# Patient Record
Sex: Male | Born: 1965 | Hispanic: Yes | Marital: Married | Smoking: Never smoker
Health system: Southern US, Community
[De-identification: ages and names within clinical notes are randomized; demographics above are authoritative.]

## PROBLEM LIST (undated history)

## (undated) DIAGNOSIS — J45909 Unspecified asthma, uncomplicated: Secondary | ICD-10-CM

---

## 2005-03-09 HISTORY — PX: COLONOSCOPY: SHX174

## 2005-03-09 LAB — HM COLONOSCOPY: HM COLON: NORMAL

## 2006-12-06 ENCOUNTER — Ambulatory Visit: Payer: Self-pay | Admitting: Internal Medicine

## 2009-03-21 ENCOUNTER — Ambulatory Visit: Payer: Self-pay | Admitting: Internal Medicine

## 2009-11-29 ENCOUNTER — Ambulatory Visit: Payer: Self-pay | Admitting: Internal Medicine

## 2011-01-30 ENCOUNTER — Ambulatory Visit: Payer: Self-pay

## 2011-02-25 ENCOUNTER — Ambulatory Visit: Payer: Self-pay | Admitting: Internal Medicine

## 2011-11-25 ENCOUNTER — Ambulatory Visit: Payer: Self-pay | Admitting: Internal Medicine

## 2013-03-18 LAB — LIPID PANEL
Cholesterol: 250 mg/dL — AB (ref 0–200)
HDL: 37 mg/dL (ref 35–70)
LDL Cholesterol: 192 mg/dL
TRIGLYCERIDES: 104 mg/dL (ref 40–160)

## 2013-03-18 LAB — PSA: PSA: 0.8

## 2013-03-18 LAB — BASIC METABOLIC PANEL
BUN: 19 mg/dL (ref 4–21)
Creatinine: 0.7 mg/dL (ref <=1.3)

## 2013-03-18 LAB — CBC AND DIFFERENTIAL: HEMOGLOBIN: 15.1 g/dL (ref 13.5–17.5)

## 2013-04-07 ENCOUNTER — Ambulatory Visit: Payer: Self-pay | Admitting: Gastroenterology

## 2013-12-18 ENCOUNTER — Ambulatory Visit: Payer: Self-pay | Admitting: Physician Assistant

## 2013-12-18 LAB — RAPID STREP-A WITH REFLX: Micro Text Report: NEGATIVE

## 2013-12-21 LAB — BETA STREP CULTURE(ARMC)

## 2014-03-20 ENCOUNTER — Ambulatory Visit: Payer: Self-pay | Admitting: Internal Medicine

## 2014-07-22 ENCOUNTER — Ambulatory Visit: Payer: Self-pay

## 2014-07-22 ENCOUNTER — Ambulatory Visit
Admission: EM | Admit: 2014-07-22 | Discharge: 2014-07-22 | Disposition: A | Discharge status: Home / Self Care | Payer: Self-pay | Attending: Family Medicine | Admitting: Family Medicine

## 2014-07-22 ENCOUNTER — Encounter: Payer: Self-pay | Admitting: *Deleted

## 2014-07-22 DIAGNOSIS — J45909 Unspecified asthma, uncomplicated: Secondary | ICD-10-CM | POA: Insufficient documentation

## 2014-07-22 DIAGNOSIS — J209 Acute bronchitis, unspecified: Secondary | ICD-10-CM

## 2014-07-22 DIAGNOSIS — R0602 Shortness of breath: Secondary | ICD-10-CM | POA: Insufficient documentation

## 2014-07-22 HISTORY — DX: Unspecified asthma, uncomplicated: J45.909

## 2014-07-22 MED ORDER — METHYLPREDNISOLONE SODIUM SUCC 125 MG IJ SOLR
125.0000 mg | Freq: Once | INTRAMUSCULAR | Status: AC
  Administered 2014-07-22: 125 mg via INTRAMUSCULAR

## 2014-07-22 MED ORDER — PREDNISONE 10 MG (21) PO TBPK: ORAL_TABLET | ORAL | Status: DC

## 2014-07-22 MED ORDER — ALBUTEROL SULFATE HFA 108 (90 BASE) MCG/ACT IN AERS: 2.0000 | INHALATION_SPRAY | RESPIRATORY_TRACT | Status: DC | PRN

## 2014-07-22 MED ORDER — IPRATROPIUM-ALBUTEROL 0.5-2.5 (3) MG/3ML IN SOLN
3.0000 mL | Freq: Once | RESPIRATORY_TRACT | Status: AC
  Administered 2014-07-22: 3 mL via RESPIRATORY_TRACT

## 2014-07-22 MED ORDER — IPRATROPIUM-ALBUTEROL 0.5-2.5 (3) MG/3ML IN SOLN
3.0000 mL | RESPIRATORY_TRACT | Status: DC
  Administered 2014-07-22: 3 mL via RESPIRATORY_TRACT

## 2014-07-22 MED ORDER — AZITHROMYCIN 250 MG PO TABS: ORAL_TABLET | ORAL | Status: DC

## 2014-07-22 NOTE — Discharge Instructions (Signed)
Asma, broncoespasmo agudo (Asthma, Acute Bronchospasm) El broncoespasmo agudo causado por el asma tambin se conoce como crisis de asma. Broncoespasmo significa que las vas respiratorias se han estrechado. La causa del estrechamiento es la inflamacin y la constriccin de los msculos de las vas respiratorias (bronquios) que se encuentran en los pulmones. Esto puede dificultar la respiracin o provocarle sibilancias y tos. [place] desencadenantes posibles son: La caspa que eliminan los animales de la piel, el pelo o las plumas de [place]. Los caros que se encuentran en el polvo de la casa. [place]. El polen de los rboles o el csped. [place]. El humo del cigarrillo o del tabaco Sustancias contaminantes como el polvo, limpiadores hogareos, aerosoles (como los [place] para el cabello), vapores de pintura, sustancias qumicas fuertes u olores intensos. El aire fro o cambios climticos. El aire fro puede causar inflamacin. El viento aumenta la cantidad de moho y polen del aire. Emociones fuertes, Engineer, production o rer intensamente. Estrs. Ciertos medicamentos como la aspirina o betabloqueantes. Los sulfitos que se encuentran en las comidas y bebidas como frutas secas y el vino. Enfermedades infecciosas o inflamatorias, como la gripe, el resfro o la inflamacin de las membranas nasales (rinitis). El reflujo gastroesofgico (ERGE). El reflujo gastroesofgico es una afeccin en la que los cidos estomacales vuelven al esfago. Los ejercicios o actividades extenuantes. [place]. Tos intensa, especialmente por la noche. Opresin en el pecho. Falta de aire. DIAGNSTICO  El mdico le har una historia clnica y le har un examen fsico. [place] Comment indicarn radiografas o anlisis de sangre para buscar otras causas de los sntomas u otras enfermedades que puedan desencadenar una crisis de asma.  TRATAMIENTO  El tratamiento est dirigido a reducir la inflamacin y [place] vas respiratorias en los pulmones. La mayor parte de las crisis asmticas se tratan con medicamentos por va inhalatoria. Entre ellos se incluyen los medicamentos de alivio rpido o medicamentos de rescate (como los broncodilatadores) y los medicamentos de control (como los corticoides inhalados). Estos medicamentos se administran a travs de Educational psychologist o de un nebulizador. Los corticoides sistmicos por va oral o por va intravenosa tambin se administran para reducir la inflamacin cuando un ataque es moderado o grave. Los antibiticos se indican solo si hay infeccin bacteriana.  INSTRUCCIONES PARA EL CUIDADO EN EL HOGAR  Reposo. Beba lquido en abundancia. Esto ayuda a diluir la mucosidad y a Transport planner. Solo consuma productos con cafena moderadamente y no consuma alcohol hasta que se haya recuperado de la enfermedad. No fume. Evite la exposicin al humo de otros fumadores. Usted tiene un rol fundamental en mantener su buena salud. Evite la exposicin a lo que [facility] respiratorios. Mantenga los medicamentos actualizados y al alcance. Siga cuidadosamente el plan de tratamiento del mdico. Utilice los medicamentos tal como se le indic. Cuando haya mucho polen o polucin, mantenga las ventanas cerradas y use el aire acondicionado o vaya a lugares con aire acondicionado. El asma requiere atencin [place]. Concurra a los controles segn las indicaciones. Si tiene Nutritional therapist de ms de 24 semanas y le han recetado medicamentos nuevos, comntelo con su obstetra y cul es su evolucin. Concurra a las consultas de control con su mdico segn las indicaciones. Despus de recuperarse de la crisis de asma, haga una cita con el mdico para conocer cmo puede reducir la probabilidad de futuros ataques. Si no cuenta con un mdico para que controle su asma, haga una cita con un  mdico de atencin primaria para hablar de esta enfermedad. Valentine DE  INMEDIATO SI:  Empeora. Tiene dificultad para respirar. Si la dificultad es intensa comunquese con el servicio de Multimedia programmer de su localidad (911 en los [place]). Siente dolor o Adult nurse. Tiene vmitos. No puede retener los lquidos. Elimina una expectoracin verde, amarilla, amarronada o sanguinolenta. Tiene fiebre y los sntomas empeoran repentinamente. Presenta dificultad para tragar. ASEGRESE DE QUE:  Comprende estas instrucciones. Controlar su afeccin. Recibir ayuda de inmediato si no mejora o si empeora. Document Released: 06/11/2008 Document Revised: 02/28/2013 [facility] Patient Information 2015 [place]. This information is not intended to replace advice given to you by your health care provider. Make sure you discuss any questions you have with your health care provider. Bronquitis aguda (Acute Bronchitis) Se denomina bronquitis cuando las vas respiratorias que van desde la trquea [employer] se irritan, se congestionan y duelen (se inflaman). La bronquitis generalmente produce flema espesa (mucosidad). Esto provoca tos. La tos es el sntoma ms frecuente de la bronquitis. Cuando la bronquitis es [place], generalmente comienza de [place] sbita y desaparece luego de algn tiempo (generalmente en 2 semanas). El hbito de fumar, las alergias y el asma pueden empeorar la bronquitis. Los episodios repetidos de bronquitis pueden causar ms problemas pulmonares. CUIDADOS EN EL HOGAR  Reposo.  Beba abundante cantidad de lquidos para mantener el pis orina) claro o amarillo plido (excepto que debe limitar la ingesta de lquidos por indicacin del mdico).  Tome slo medicamentos de venta libre o recetados, segn las indicaciones del mdico.  Evite fumar o aspirar el humo de otros fumadores. Esto puede empeorar la bronquitis. Si es fumador, considere el uso de chicles o parches en la piel de nicotina. Si deja de fumar, sus pulmones se curarn ms  rpido.  Reduzca la probabilidad de enfermarse nuevamente de bronquitis de este modo:  Lvese las manos con frecuencia.  Evite las personas que tengan sntomas de resfro.  Trate de no llevarse las manos a la boca, la nariz o los ojos.  Concurra a las consultas de control con el mdico, segn las indicaciones. SOLICITE AYUDA SI: Los sntomas no mejoran despus de 1 semana de tratamiento. Los sntomas son:  Donnal Moat.  Cristy Hilts.  Eliminar moco espeso al toser.  Dolores [employer] cuerpo.  Congestin en el pecho.  Escalofros.  Falta de aire.  Dolor de Investment banker, operational. SOLICITE AYUDA DE INMEDIATO SI:   Le sube la fiebre.  Tiene escalofros.  Comienza a Buyer, retail aire de manera preocupante.  Tiene expectoracin con sangre (esputo).  Devuelve (vomita) con frecuencia.  Su organismo pierde mucho lquido (deshidratacin).  Sufre un dolor intenso de [place].  Se desmaya. ASEGRESE DE QUE:   Comprende estas instrucciones.  Controlar su afeccin.  Recibir ayuda de inmediato si no mejora o si empeora. Document Released: 10/26/2012 [facility] Patient Information 2015 [place]. This information is not intended to replace advice given to you by your health care provider. Make sure you discuss any questions you have with your health care provider.

## 2014-07-22 NOTE — ED Provider Notes (Signed)
CSN: [id number]     Arrival date & time 07/22/14  1448 History   First MD Initiated Contact with Patient 07/22/14 1518     Chief Complaint  Patient presents with  . Shortness of Breath   (Consider location/radiation/quality/duration/timing/severity/associated sxs/prior Treatment) Patient is a 49 y.o. male presenting with shortness of breath. The history is provided by the patient and a significant other. The history is limited by a language barrier. No language interpreter was used Katrinka Blazing(Smith other provides most of the transition).  Shortness of Breath Severity:  Severe Onset quality:  Unable to specify Duration:  3 months (He's had trouble breathing for over 3 months according to his significant other.) Timing:  Intermittent Progression:  Waxing and waning (She states that the breathing has gotten so bad today that she felt that even though they did not have insurance she need for him to be seen and evaluated.) Chronicity:  Chronic (She she reports that she is giving him some prednisone tablets he has not used before she is M use her inhaler and that her daughter had a weak sinus antibiotic that she did not know the name of which she allowed him to take as well. All these different me) Context: activity, fumes and URI   Relieved by:  Inhaler Worsened by:  Coughing, deep breathing and smoke exposure Ineffective treatments:  Inhaler Associated symptoms: sputum production   Risk factors: no tobacco use     Past Medical History  Diagnosis Date  . Asthma    History reviewed. No pertinent past surgical history. Family History  Problem Relation Age of Onset  . Stroke Father   . Cancer Sister   . Cancer Brother    History  Substance Use Topics  . Smoking status: Never Smoker   . Smokeless tobacco: Not on file  . Alcohol Use: No    Review of Systems  Unable to perform ROS HENT: Positive for congestion.   Respiratory: Positive for sputum production and shortness of breath.   All  other systems reviewed and are negative.   Allergies  Penicillins  Home Medications   Prior to Admission medications   Not on File   BP 132/81 mmHg  Pulse 86  Temp(Src) 98.5 F (36.9 C) (Tympanic)  Resp   Ht 5\' 8"  (1.727 m)  Wt 185 lb (83.915 kg)  BMI 28.14 kg/m2  SpO2 93% Physical Exam  Constitutional: He appears well-nourished. He appears distressed.  HENT:  Head: Normocephalic and atraumatic.  Right Ear: External ear normal.  Left Ear: External ear normal.  Nose: Nose normal.  Mouth/Throat: Oropharynx is clear and moist.  Eyes: Pupils are equal, round, and reactive to light.  Neck: Neck supple. No tracheal deviation present.  Cardiovascular: Normal rate and regular rhythm.   Pulmonary/Chest: Tachypnea noted. He has decreased breath sounds. He has wheezes. Chest wall is not dull to percussion. He exhibits no mass.  Musculoskeletal: Normal range of motion.  Lymphadenopathy:    He has no cervical adenopathy.  Neurological: He is alert.  Skin: Skin is warm. No rash noted. No erythema.  Nursing note and vitals reviewed.   ED Course  Procedures (including critical care time) Labs Review Labs Reviewed - No data to display'Imaging Review No results found.  Patient sent for a chest x-ray. Given two aerosol treatments here, Solu-Medrol injection, will be placed on oral prednisone for probably 6 days, antibiotic, and we'll give him his own inhaler. [facility]  MDM  No diagnosis found.     Hassan RowanEugene Helmi Hechavarria, MD 07/22/14 [id number]

## 2014-07-22 NOTE — ED Notes (Signed)
Known history of asthma, self medicated with left over family pednisone and a "weak antibiotic"  [facility]  Wheezing A/P, audible wheezing, moving very little air.  Dr Thurmond ButtsWade in to assess patient.  Meds given, chest xray pending.  O2 sats 93&% on RA.

## 2014-11-24 ENCOUNTER — Ambulatory Visit: Payer: Self-pay

## 2014-11-24 ENCOUNTER — Ambulatory Visit
Admission: EM | Admit: 2014-11-24 | Discharge: 2014-11-24 | Disposition: A | Discharge status: Home / Self Care | Payer: Self-pay | Attending: Family Medicine | Admitting: Family Medicine

## 2014-11-24 DIAGNOSIS — J45901 Unspecified asthma with (acute) exacerbation: Secondary | ICD-10-CM

## 2014-11-24 MED ORDER — PREDNISONE 50 MG PO TABS: 50.0000 mg | ORAL_TABLET | Freq: Every day | ORAL | Status: DC

## 2014-11-24 MED ORDER — IPRATROPIUM-ALBUTEROL 0.5-2.5 (3) MG/3ML IN SOLN
3.0000 mL | Freq: Once | RESPIRATORY_TRACT | Status: AC
  Administered 2014-11-24: 3 mL via RESPIRATORY_TRACT

## 2014-11-24 MED ORDER — ALBUTEROL SULFATE HFA 108 (90 BASE) MCG/ACT IN AERS: 1.0000 | INHALATION_SPRAY | Freq: Four times a day (QID) | RESPIRATORY_TRACT | Status: DC | PRN

## 2014-11-24 MED ORDER — SULFAMETHOXAZOLE-TRIMETHOPRIM 800-160 MG PO TABS: 1.0000 | ORAL_TABLET | Freq: Two times a day (BID) | ORAL | Status: DC

## 2014-11-24 NOTE — Discharge Instructions (Signed)

## 2014-11-24 NOTE — ED Provider Notes (Signed)
CSN: [id number]     Arrival date & time 11/24/14  1428 History   First MD Initiated Contact with Patient 11/24/14 1556     Chief Complaint  Patient presents with  . Cough    Pt started yesterday with cough, chest congestion, and wheezing. Tried his inhaler but not helping. Chest and head hurts with coughing and deep breathing    (Consider location/radiation/quality/duration/timing/severity/associated sxs/prior Treatment) HPI this a 49 year old Hispanic male presents with 2 weeks history of difficulty breathing and productive cough of green-yellow sputum and chills but no fever. He has a history of asthma has had flareups in the past. Heused the albuterol inhaler but to no avail. He became much worse yesterday and presents today with audible wheezing. Yesterday he states that he had a 2 hour coughing spell with pain over his right upper chest. Today he is afebrile with an O2 sat of 97%. Pulse rate of 83. He does not smoke.    Past Medical History  Diagnosis Date  . Asthma    History reviewed. No pertinent past surgical history. Family History  Problem Relation Age of Onset  . Stroke Father   . Cancer Sister   . Cancer Brother    Social History  Substance Use Topics  . Smoking status: Never Smoker   . Smokeless tobacco: None  . Alcohol Use: No    Review of Systems  Constitutional: Positive for chills. Negative for fever and fatigue.  HENT: Positive for congestion.   Respiratory: Positive for cough, shortness of breath and wheezing.   All other systems reviewed and are negative.   Allergies  Penicillins  Home Medications   Prior to Admission medications   Medication Sig Start Date End Date Taking? Authorizing Provider  albuterol (PROVENTIL HFA;VENTOLIN HFA) 108 (90 BASE) MCG/ACT inhaler Inhale 2 puffs into the lungs every 4 (four) hours as needed for wheezing or shortness of breath. 07/22/14   Hassan Rowan, MD  albuterol (PROVENTIL HFA;VENTOLIN HFA) 108 (90 BASE) MCG/ACT  inhaler Inhale 1-2 puffs into the lungs every 6 (six) hours as needed for wheezing or shortness of breath. 11/24/14   Lutricia Feil, PA-C  azithromycin (ZITHROMAX Z-PAK) 250 MG tablet Take 2 tablets first day and then 1 po a day for 4 days 07/22/14   Hassan Rowan, MD  predniSONE (DELTASONE) 50 MG tablet Take 1 tablet (50 mg total) by mouth daily. 11/24/14   Lutricia Feil, PA-C  sulfamethoxazole-trimethoprim (BACTRIM DS,SEPTRA DS) 800-160 MG per tablet Take 1 tablet by mouth 2 (two) times daily. 11/24/14   Lutricia Feil, PA-C   Meds Ordered and Administered this Visit   Medications  ipratropium-albuterol (DUONEB) 0.5-2.5 (3) MG/3ML nebulizer solution 3 mL (3 mLs Nebulization Given 11/24/14 1610)    BP 145/85 mmHg  Pulse 83  Temp(Src) 97.9 F (36.6 C) (Tympanic)  Ht  (1.626 m)  Wt 177 lb (80.287 kg)  BMI 30.37 kg/m2  SpO2 97% No data found.   Physical Exam  Constitutional: He is oriented to person, place, and time. He appears well-developed and well-nourished.  HENT:  Head: Normocephalic and atraumatic.  Right Ear: External ear normal.  Left Ear: External ear normal.  Eyes: Pupils are equal, round, and reactive to light.  Neck: Neck supple.  Cardiovascular: Normal rate and regular rhythm.  Exam reveals no gallop and no friction rub.   No murmur heard. Pulmonary/Chest: Effort normal. No stridor. No respiratory distress. He has wheezes. He has no rales. He exhibits no tenderness.  Lymphadenopathy:    He has no cervical adenopathy.  Neurological: He is alert and oriented to person, place, and time.  Skin: Skin is warm and dry.  Psychiatric: He has a normal mood and affect. His behavior is normal. Judgment and thought content normal.  Nursing note and vitals reviewed.   ED Course  Procedures (including critical care time)  Labs Review Labs Reviewed - No data to display  Imaging Review Dg Chest 2 View  11/24/2014   CLINICAL DATA:  Cough, asthma  EXAM: CHEST  2 VIEW   COMPARISON:  07/22/2014  FINDINGS: Cardiomediastinal silhouette is stable. No acute infiltrate or pleural effusion. No pulmonary edema. Bony thorax is unremarkable.  IMPRESSION: No active cardiopulmonary disease.   Electronically Signed   By: Natasha Mead M.D.   On: 11/24/2014 16:25     Visual Acuity Review  Right Eye Distance:   Left Eye Distance:   Bilateral Distance:    Right Eye Near:   Left Eye Near:    Bilateral Near:     Medications  ipratropium-albuterol (DUONEB) 0.5-2.5 (3) MG/3ML nebulizer solution 3 mL (3 mLs Nebulization Given 11/24/14 1610)      MDM   1. Asthmatic bronchitis with acute exacerbation    New Prescriptions   ALBUTEROL (PROVENTIL HFA;VENTOLIN HFA) 108 (90 BASE) MCG/ACT INHALER    Inhale 1-2 puffs into the lungs every 6 (six) hours as needed for wheezing or shortness of breath.   PREDNISONE (DELTASONE) 50 MG TABLET    Take 1 tablet (50 mg total) by mouth daily.   SULFAMETHOXAZOLE-TRIMETHOPRIM (BACTRIM DS,SEPTRA DS) 800-160 MG PER TABLET    Take 1 tablet by mouth 2 (two) times daily.  Plan: 1. Test/x-ray results and diagnosis reviewed with patient 2. rx as per orders; risks, benefits, potential side effects reviewed with patient 3. Recommend supportive treatment with albuterol.  4. F/u PCP next week.  Lutricia Feil, PA-C 11/24/14 1714

## 2015-01-01 ENCOUNTER — Encounter: Payer: Self-pay | Admitting: Internal Medicine

## 2015-01-01 DIAGNOSIS — L509 Urticaria, unspecified: Secondary | ICD-10-CM | POA: Insufficient documentation

## 2015-01-01 DIAGNOSIS — Z800 Family history of malignant neoplasm of digestive organs: Secondary | ICD-10-CM | POA: Insufficient documentation

## 2015-01-01 DIAGNOSIS — K219 Gastro-esophageal reflux disease without esophagitis: Secondary | ICD-10-CM | POA: Insufficient documentation

## 2015-01-04 ENCOUNTER — Ambulatory Visit (INDEPENDENT_AMBULATORY_CARE_PROVIDER_SITE_OTHER): Payer: Self-pay | Admitting: Internal Medicine

## 2015-01-04 ENCOUNTER — Encounter: Payer: Self-pay | Admitting: Internal Medicine

## 2015-01-04 VITALS — BP 128/60 | HR 125 | Temp 100.6°F | Ht 64.0 in | Wt 175.4 lb

## 2015-01-04 DIAGNOSIS — J45901 Unspecified asthma with (acute) exacerbation: Secondary | ICD-10-CM

## 2015-01-04 MED ORDER — METHYLPREDNISOLONE 4 MG PO TBPK: ORAL_TABLET | ORAL | Status: DC

## 2015-01-04 MED ORDER — ALBUTEROL SULFATE (2.5 MG/3ML) 0.083% IN NEBU: 2.5000 mg | INHALATION_SOLUTION | Freq: Four times a day (QID) | RESPIRATORY_TRACT | Status: DC | PRN

## 2015-01-04 MED ORDER — AZITHROMYCIN 250 MG PO TABS: ORAL_TABLET | ORAL | Status: DC

## 2015-01-04 NOTE — Progress Notes (Signed)
Date:  01/04/2015   Name:  Ronnie Hunt   DOB:  1965-05-12   MRN:  [id number]   Chief Complaint: Asthma  Patient had been struggling with asthma exacerbation for the past 6 weeks. He was seen in September at urgent care. He was prescribed a prednisone pulse dose and Bactrim. He has continued to use an albuterol inhaler. He states that he only improved slightly and then over the past several weeks has been getting worse. Last night he did not sleep because of shortness of breath wheezing and headache. His only medication is a ProAir inhaler.  He has a tight cough productive primarily of thick white phlegm but occasionally yellow phlegm.   Review of Systems  Constitutional: Positive for fever, diaphoresis and fatigue.  Respiratory: Positive for chest tightness, shortness of breath and wheezing. Negative for choking.   Cardiovascular: Negative for chest pain and palpitations.  Gastrointestinal: Negative for vomiting, abdominal pain and diarrhea.  Musculoskeletal: Negative for neck pain.  Neurological: Positive for tremors, light-headedness and headaches. Negative for syncope.    Patient Active Problem List   Diagnosis Date Noted  . Family history of colon cancer 01/01/2015  . Gastro-esophageal reflux disease without esophagitis 01/01/2015  . Asthma, mild intermittent 01/01/2015  . Hive 01/01/2015    Prior to Admission medications   Medication Sig Start Date End Date Taking? Authorizing Provider  albuterol (PROVENTIL HFA;VENTOLIN HFA) 108 (90 BASE) MCG/ACT inhaler Inhale 2 puffs into the lungs every 4 (four) hours as needed for wheezing or shortness of breath. 07/22/14  Yes Ronnie RowanEugene Wade, MD    Allergies  Allergen Reactions  . Penicillins Swelling    Past Surgical History  Procedure Laterality Date  . Colonoscopy  2007    normal    Social History  Substance Use Topics  . Smoking status: Never Smoker   . Smokeless tobacco: None  . Alcohol Use: 2.4 oz/week    4 Standard  drinks or equivalent per week     Medication list has been reviewed and updated.    Physical Exam  Constitutional: He appears well-developed. He appears distressed.  Cardiovascular: Regular rhythm.  Tachycardia present.   Pulmonary/Chest: Accessory muscle usage present. Tachypnea noted. He is in respiratory distress. He has decreased breath sounds. He has wheezes in the right upper field, the right middle field, the right lower field, the left upper field, the left middle field and the left lower field.  Skin: He is diaphoretic.  Nursing note and vitals reviewed.  Patient is given 2 albuterol nebulizer breathing treatments. After which he had improved air movement and subjective improvement in shortness of breath. Wheezes persist throughout all lung zones.   BP 128/60 mmHg  Pulse 120  Temp(Src) 100.6 F (38.1 C)  Ht 5\' 4"  (1.626 m)  Wt 175 lb 6.4 oz (79.561 kg)  BMI 30.09 kg/m2  SpO2 89%  Assessment and Plan: 1. Asthma with exacerbation, unspecified asthma severity Patient will purchase a nebulizer Begin Dulera 100/5 one to 2 puffs twice a day  (samples given) May use albuterol MDI when away from home - azithromycin (ZITHROMAX Z-PAK) 250 MG tablet; Take 2 tabs on day 1 the one a day for 4 more days  Dispense: 6 each; Refill: 0 - methylPREDNISolone (MEDROL DOSEPAK) 4 MG TBPK tablet; Take 6 pills on day 1 the 5 pills day 2 then 4 pills day 3 then 3 pills day 4 then 2 pills day 5 then one pills day 6 then stop  Dispense: 21  tablet; Refill: 0 - albuterol (PROVENTIL) (2.5 MG/3ML) 0.083% nebulizer solution; Take 3 mLs (2.5 mg total) by nebulization every 6 (six) hours as needed for wheezing or shortness of breath.  Dispense: 150 mL; Refill: 1   Bari Edward, MD [facility]  01/04/2015

## 2015-01-04 NOTE — Progress Notes (Signed)
Date:  01/04/2015   Name:  Loyal GamblerJulio Reynoso Woody   DOB:  10/31/1965   MRN:  [id number]   Chief Complaint: Asthma   Review of Systems  Patient Active Problem List   Diagnosis Date Noted  . Family history of colon cancer 01/01/2015  . Gastro-esophageal reflux disease without esophagitis 01/01/2015  . Asthma, mild intermittent 01/01/2015  . Hive 01/01/2015    Prior to Admission medications   Medication Sig Start Date End Date Taking? Authorizing Provider  albuterol (PROVENTIL HFA;VENTOLIN HFA) 108 (90 BASE) MCG/ACT inhaler Inhale 2 puffs into the lungs every 4 (four) hours as needed for wheezing or shortness of breath. 07/22/14  Yes Hassan RowanEugene Wade, MD    Allergies  Allergen Reactions  . Penicillins Swelling    Past Surgical History  Procedure Laterality Date  . Colonoscopy  2007    normal    Social History  Substance Use Topics  . Smoking status: Never Smoker   . Smokeless tobacco: None  . Alcohol Use: 2.4 oz/week    4 Standard drinks or equivalent per week     Medication list has been reviewed and updated.  Physical Examination:  Physical Exam  BP 128/60 mmHg  Pulse 120  Temp(Src) 100.6 F (38.1 C)  Ht 5\' 4"  (1.626 m)  Wt 175 lb 6.4 oz (79.561 kg)  BMI 30.09 kg/m2  SpO2 89%  Assessment and Plan:

## 2015-01-07 ENCOUNTER — Ambulatory Visit: Payer: Self-pay | Admitting: Internal Medicine

## 2015-12-10 ENCOUNTER — Ambulatory Visit: Payer: Self-pay | Admitting: Internal Medicine

## 2016-02-12 ENCOUNTER — Ambulatory Visit (INDEPENDENT_AMBULATORY_CARE_PROVIDER_SITE_OTHER): Payer: Self-pay | Admitting: Internal Medicine

## 2016-02-12 ENCOUNTER — Encounter: Payer: Self-pay | Admitting: Internal Medicine

## 2016-02-12 ENCOUNTER — Other Ambulatory Visit: Payer: Self-pay | Admitting: Internal Medicine

## 2016-02-12 VITALS — BP 132/80 | HR 74 | Temp 98.1°F | Resp 16 | Ht 64.0 in | Wt 190.0 lb

## 2016-02-12 DIAGNOSIS — J4520 Mild intermittent asthma, uncomplicated: Secondary | ICD-10-CM

## 2016-02-12 DIAGNOSIS — J40 Bronchitis, not specified as acute or chronic: Secondary | ICD-10-CM

## 2016-02-12 MED ORDER — MUPIROCIN CALCIUM 2 % EX CREA: 1.0000 "application " | TOPICAL_CREAM | Freq: Two times a day (BID) | CUTANEOUS | 0 refills | Status: DC

## 2016-02-12 MED ORDER — ALBUTEROL SULFATE (2.5 MG/3ML) 0.083% IN NEBU: 2.5000 mg | INHALATION_SOLUTION | Freq: Four times a day (QID) | RESPIRATORY_TRACT | 1 refills | Status: DC | PRN

## 2016-02-12 MED ORDER — AZITHROMYCIN 250 MG PO TABS: ORAL_TABLET | ORAL | 0 refills | Status: DC

## 2016-02-12 MED ORDER — ALBUTEROL SULFATE HFA 108 (90 BASE) MCG/ACT IN AERS: 2.0000 | INHALATION_SPRAY | RESPIRATORY_TRACT | 5 refills | Status: DC | PRN

## 2016-02-12 NOTE — Patient Instructions (Signed)
Flovent 2 puffs twice a day - routinely (samples).

## 2016-02-12 NOTE — Progress Notes (Signed)
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 Wheezing at night and coughing x 4 weeks. Running out of solution for nebulizer but using it daily. ) Asthma  He complains of chest tightness, cough (productive), shortness of breath and wheezing. This is a chronic problem. The problem occurs intermittently. Pertinent negatives include no chest pain, ear pain, fever or sore throat. His symptoms are not alleviated by beta-agonist. His past medical history is significant for asthma.  He has no insurance and can not afford a steroid inhaler.  Review of Systems  Constitutional: Positive for fatigue. Negative for chills, fever and unexpected weight change.  HENT: Negative for dental problem, ear pain, sinus pain and sore throat.   Eyes: Negative for visual disturbance.  Respiratory: Positive for cough (productive), shortness of breath and wheezing.   Cardiovascular: Negative for chest pain and palpitations.  Gastrointestinal: Negative for constipation and vomiting.  Psychiatric/Behavioral: Positive for sleep disturbance (from wheezing).    Patient Active Problem List   Diagnosis Date Noted  . Family history of colon cancer 01/01/2015  . Gastro-esophageal reflux disease without esophagitis 01/01/2015  . Asthma, mild intermittent 01/01/2015  . Hive 01/01/2015    Prior to Admission medications   Medication Sig Start Date End Date Taking? Authorizing Provider  albuterol (PROVENTIL HFA;VENTOLIN HFA) 108 (90 BASE) MCG/ACT inhaler Inhale 2 puffs into the lungs every 4 (four) hours as needed for wheezing or shortness of breath. 07/22/14  Yes Hassan RowanEugene Wade, MD  albuterol (PROVENTIL) (2.5 MG/3ML) 0.083% nebulizer solution Take 3 mLs (2.5 mg total) by nebulization every 6 (six) hours as needed for wheezing or shortness of breath. 01/04/15  Yes Reubin MilanLaura H Uday Jantz, MD     Allergies  Allergen Reactions  . Penicillins Swelling    Past Surgical History:  Procedure Laterality Date  . COLONOSCOPY  2007   normal    Social History  Substance Use Topics  . Smoking status: Never Smoker  . Smokeless tobacco: Never Used  . Alcohol use 2.4 oz/week    4 Standard drinks or equivalent per week     Medication list has been reviewed and updated.   Physical Exam  Constitutional: He is oriented to person, place, and time. He appears well-developed. No distress.  HENT:  Head: Normocephalic and atraumatic.  Cardiovascular: Normal rate, regular rhythm and normal heart sounds.   Pulmonary/Chest: Effort normal. No accessory muscle usage. No respiratory distress. He has decreased breath sounds. He has wheezes. He has no rhonchi.  Musculoskeletal: Normal range of motion.  Neurological: He is alert and oriented to person, place, and time.  Skin: Skin is warm and dry. No rash noted.  Psychiatric: He has a normal mood and affect. His behavior is normal. Thought content normal.  Nursing note and vitals reviewed.   BP 132/80   Pulse 74   Temp 98.1 F (36.7 C)   Resp 16   Ht 5\' 4"  (1.626 m)   Wt 190 lb (86.2 kg)   SpO2 95%   BMI 32.61 kg/m   Assessment and Plan: 1. Mild intermittent asthma without complication Samples of Flovent 44 mcg - 2 puffs bid given - albuterol (PROVENTIL HFA;VENTOLIN HFA) 108 (90 Base) MCG/ACT inhaler; Inhale 2 puffs into the lungs every 4 (four) hours as needed for wheezing or shortness of breath.  Dispense: 1 Inhaler; Refill: 5 - albuterol (PROVENTIL) (2.5 MG/3ML) 0.083% nebulizer solution; Take 3 mLs (2.5 mg total) by nebulization every 6 (six) hours as needed for wheezing or shortness of breath.  Dispense: 150 mL; Refill: 1 - azithromycin (ZITHROMAX Z-PAK) 250 MG tablet; Take 2 tabs on day 1 the one a day for 4 more days  Dispense: 6 each; Refill: 0  2. Bronchitis Zpak as above   Bari EdwardLaura Deona Novitski, MD [facility]  02/12/2016

## 2016-04-02 ENCOUNTER — Ambulatory Visit
Admission: EM | Admit: 2016-04-02 | Discharge: 2016-04-02 | Disposition: A | Discharge status: Home / Self Care | Payer: Self-pay | Attending: Family Medicine | Admitting: Family Medicine

## 2016-04-02 ENCOUNTER — Ambulatory Visit (INDEPENDENT_AMBULATORY_CARE_PROVIDER_SITE_OTHER): Payer: Self-pay

## 2016-04-02 DIAGNOSIS — J029 Acute pharyngitis, unspecified: Secondary | ICD-10-CM

## 2016-04-02 DIAGNOSIS — R69 Illness, unspecified: Secondary | ICD-10-CM

## 2016-04-02 DIAGNOSIS — R0602 Shortness of breath: Secondary | ICD-10-CM

## 2016-04-02 DIAGNOSIS — J4521 Mild intermittent asthma with (acute) exacerbation: Secondary | ICD-10-CM

## 2016-04-02 DIAGNOSIS — J111 Influenza due to unidentified influenza virus with other respiratory manifestations: Secondary | ICD-10-CM

## 2016-04-02 LAB — RAPID STREP SCREEN (MED CTR MEBANE ONLY): Streptococcus, Group A Screen (Direct): NEGATIVE

## 2016-04-02 MED ORDER — IPRATROPIUM-ALBUTEROL 0.5-2.5 (3) MG/3ML IN SOLN
3.0000 mL | Freq: Once | RESPIRATORY_TRACT | Status: AC
  Administered 2016-04-02: 3 mL via RESPIRATORY_TRACT

## 2016-04-02 MED ORDER — METHYLPREDNISOLONE ACETATE 80 MG/ML IJ SUSP: 80.0000 mg | Freq: Once | INTRAMUSCULAR | Status: DC

## 2016-04-02 MED ORDER — ALBUTEROL SULFATE HFA 108 (90 BASE) MCG/ACT IN AERS: 2.0000 | INHALATION_SPRAY | RESPIRATORY_TRACT | 1 refills | Status: DC | PRN

## 2016-04-02 MED ORDER — PREDNISONE 10 MG (21) PO TBPK: ORAL_TABLET | ORAL | 0 refills | Status: DC

## 2016-04-02 MED ORDER — METHYLPREDNISOLONE SODIUM SUCC 125 MG IJ SOLR
125.0000 mg | Freq: Once | INTRAMUSCULAR | Status: AC
  Administered 2016-04-02: 125 mg via INTRAMUSCULAR

## 2016-04-02 MED ORDER — HYDROCOD POLST-CPM POLST ER 10-8 MG/5ML PO SUER: 5.0000 mL | Freq: Two times a day (BID) | ORAL | 0 refills | Status: DC | PRN

## 2016-04-02 MED ORDER — AZITHROMYCIN 500 MG PO TABS: ORAL_TABLET | ORAL | 0 refills | Status: DC

## 2016-04-02 MED ORDER — OSELTAMIVIR PHOSPHATE 75 MG PO CAPS: 75.0000 mg | ORAL_CAPSULE | Freq: Two times a day (BID) | ORAL | 0 refills | Status: DC

## 2016-04-02 NOTE — ED Triage Notes (Addendum)
Pt c/o asthma exacerbation since yesterday and tachycardiac. He has been using his at home inhalers and nebulizer treatment without improvement. NO energy, fever, body aches, and chills.

## 2016-04-02 NOTE — ED Provider Notes (Addendum)
[facility]    CSN: [id number] Arrival date & time: 04/02/16  1513     History   Chief Complaint Chief Complaint  Patient presents with  . Asthma    HPI Ronnie Hunt is a 51 y.o. male.   Patient is a 51 year old Hispanic male English is not the best but wife provides some translation. Apparently he has a chronic history of asthma and wheezing exacerbation of his asthma. Yesterday he was at home started having a sore throat took a shower and got immediately worse. This morning he was wheezing shortness of breath no wheezing or shortness breath has gotten worse. He did not get his flu shot this year he has a sore throat coughing yellowish-green sputum sore throat and aching all over. Family history of colon cancer and stroke. He has reflux and asthma. He does not smoke or never smoked is allergic to penicillin   The history is provided by the patient and the spouse. No language interpreter was used.  Asthma  This is a new problem. The current episode started yesterday. The problem occurs constantly. The problem has been gradually worsening. Associated symptoms include shortness of breath. Pertinent negatives include no chest pain, no abdominal pain and no headaches. The symptoms are aggravated by exertion. Nothing relieves the symptoms.    Past Medical History:  Diagnosis Date  . Asthma     Patient Active Problem List   Diagnosis Date Noted  . Family history of colon cancer 01/01/2015  . Gastro-esophageal reflux disease without esophagitis 01/01/2015  . Asthma, mild intermittent 01/01/2015  . Hive 01/01/2015    Past Surgical History:  Procedure Laterality Date  . COLONOSCOPY  2007   normal       Home Medications    Prior to Admission medications   Medication Sig Start Date End Date Taking? Authorizing Provider  albuterol (PROVENTIL HFA;VENTOLIN HFA) 108 (90 Base) MCG/ACT inhaler Inhale 2 puffs into the lungs every 4 (four) hours as needed for  wheezing or shortness of breath. 02/12/16  Yes Reubin Milan, MD  albuterol (PROVENTIL) (2.5 MG/3ML) 0.083% nebulizer solution Take 3 mLs (2.5 mg total) by nebulization every 6 (six) hours as needed for wheezing or shortness of breath. 02/12/16  Yes Reubin Milan, MD  albuterol (PROVENTIL HFA;VENTOLIN HFA) 108 (90 Base) MCG/ACT inhaler Inhale 2 puffs into the lungs every 4 (four) hours as needed for wheezing or shortness of breath. 04/02/16   Hassan Rowan, MD  azithromycin (ZITHROMAX) 500 MG tablet 1 tablet a day for 5 days 04/02/16   Hassan Rowan, MD  chlorpheniramine-HYDROcodone [facility]) 10-8 MG/5ML SUER Take 5 mLs by mouth every 12 (twelve) hours as needed for cough. 04/02/16   Hassan Rowan, MD  oseltamivir (TAMIFLU) 75 MG capsule Take 1 capsule (75 mg total) by mouth 2 (two) times daily. 04/02/16   Hassan Rowan, MD  oseltamivir (TAMIFLU) 75 MG capsule Take 1 capsule (75 mg total) by mouth 2 (two) times daily. 04/02/16   Hassan Rowan, MD  predniSONE (STERAPRED UNI-PAK 21 TAB) 10 MG (21) TBPK tablet Sig 6 tablet day 1, 5 tablets day 2, 4 tablets day 3,,3tablets day 4, 2 tablets day 5, 1 tablet day 6 take all tablets orally 04/02/16   Hassan Rowan, MD    Family History Family History  Problem Relation Age of Onset  . Stroke Father   . Cancer Sister   . Colon cancer Brother     age 47    Social  History Social History  Substance Use Topics  . Smoking status: Never Smoker  . Smokeless tobacco: Never Used  . Alcohol use 2.4 oz/week    4 Standard drinks or equivalent per week     Allergies   Penicillins   Review of Systems Review of Systems  Constitutional: Positive for fever.  HENT: Positive for sore throat.   Respiratory: Positive for cough, chest tightness, shortness of breath and wheezing.   Cardiovascular: Negative for chest pain.  Gastrointestinal: Negative for abdominal pain.  Musculoskeletal: Positive for myalgias.  Neurological: Positive for weakness. Negative  for headaches.     Physical Exam Triage Vital Signs ED Triage Vitals  Enc Vitals Group     BP 04/02/16 1706 120/66     Pulse Rate 04/02/16 1706 (!) 126     Resp 04/02/16 1706 18     Temp 04/02/16 1706 (!) 100.4 F (38 C)     Temp Source 04/02/16 1706 Oral     SpO2 04/02/16 1706 95 %     Weight 04/02/16 1707 180 lb (81.6 kg)     Height 04/02/16 1707 5\' 5"  (1.651 m)     Head Circumference --      Peak Flow --      Pain Score 04/02/16 1708 6     Pain Loc --      Pain Edu? --      Excl. in GC? --    No data found.   Updated Vital Signs BP 120/66 (BP Location: Left Arm)   Pulse (!) 126   Temp (!) 100.4 F (38 C) (Oral)   Resp 18   Ht 5\' 5"  (1.651 m)   Wt 180 lb (81.6 kg)   SpO2 93%   BMI 29.95 kg/m   Visual Acuity Right Eye Distance:   Left Eye Distance:   Bilateral Distance:    Right Eye Near:   Left Eye Near:    Bilateral Near:     Physical Exam  Constitutional: He is oriented to person, place, and time. He appears well-developed and well-nourished.  HENT:  Head: Normocephalic and atraumatic.  Right Ear: Hearing, tympanic membrane, external ear and ear canal normal.  Left Ear: Hearing, tympanic membrane and external ear normal.  Mouth/Throat: Uvula is midline and oropharynx is clear and moist. No oral lesions. No dental caries.  Eyes: Pupils are equal, round, and reactive to light.  Neck: Normal range of motion.  Cardiovascular: Normal rate and regular rhythm.   Pulmonary/Chest: He has decreased breath sounds. He has wheezes.  Musculoskeletal: Normal range of motion. He exhibits no deformity.  Lymphadenopathy:    He has cervical adenopathy.  Neurological: He is alert and oriented to person, place, and time.  Skin: Skin is warm.  Psychiatric: He has a normal mood and affect.  Vitals reviewed.    UC Treatments / Results  Labs (all labs ordered are listed, but only abnormal results are displayed) Labs Reviewed  RAPID STREP SCREEN (NOT [facility])    CULTURE, GROUP A STREP [facility])    EKG  EKG Interpretation None       Radiology Dg Chest 2 View  Result Date: 04/02/2016 CLINICAL DATA:  Patient with asthma exacerbation.  Tachycardia. EXAM: CHEST  2 VIEW COMPARISON:  Chest radiograph 11/24/2014. FINDINGS: Stable cardiac and mediastinal contours. No consolidative pulmonary opacities. No pleural effusion or pneumothorax. Thoracic spine degenerative changes. IMPRESSION: No active cardiopulmonary disease. Electronically Signed   By: Annia Beltrew  Davis M.D.   On: 04/02/2016  18:46    Procedures Procedures (including critical care time)  Medications Ordered in UC Medications  ipratropium-albuterol (DUONEB) 0.5-2.5 (3) MG/3ML nebulizer solution 3 mL (3 mLs Nebulization Given 04/02/16 1715)  ipratropium-albuterol (DUONEB) 0.5-2.5 (3) MG/3ML nebulizer solution 3 mL (3 mLs Nebulization Given 04/02/16 1756)  methylPREDNISolone sodium succinate (SOLU-MEDROL) 125 mg/2 mL injection 125 mg (125 mg Intramuscular Given 04/02/16 1804)     Initial Impression / Assessment and Plan / UC Course  I have reviewed the triage vital signs and the nursing notes.  Pertinent labs & imaging results that were available during my care of the patient were reviewed by me and considered in my medical decision making (see chart for details).   will going treat patient with for both flu acute bronchospasm, will treat with [place] next 1 teaspoon twice a day albuterol inhaler Tamiflu and Zithromax  Final Clinical Impressions(s) / UC Diagnoses   Final diagnoses:  Exacerbation of intermittent asthma, unspecified asthma severity  Influenza-like illness  Shortness of breath  Pharyngitis, unspecified etiology    New Prescriptions New Prescriptions   ALBUTEROL (PROVENTIL HFA;VENTOLIN HFA) 108 (90 BASE) MCG/ACT INHALER    Inhale 2 puffs into the lungs every 4 (four) hours as needed for wheezing or shortness of breath.   AZITHROMYCIN (ZITHROMAX) 500 MG TABLET    1 tablet a day for  5 days   CHLORPHENIRAMINE-HYDROCODONE (TUSSIONEX PENNKINETIC ER) 10-8 MG/5ML SUER    Take 5 mLs by mouth every 12 (twelve) hours as needed for cough.   OSELTAMIVIR (TAMIFLU) 75 MG CAPSULE    Take 1 capsule (75 mg total) by mouth 2 (two) times daily.   OSELTAMIVIR (TAMIFLU) 75 MG CAPSULE    Take 1 capsule (75 mg total) by mouth 2 (two) times daily.   PREDNISONE (STERAPRED UNI-PAK 21 TAB) 10 MG (21) TBPK TABLET    Sig 6 tablet day 1, 5 tablets day 2, 4 tablets day 3,,3tablets day 4, 2 tablets day 5, 1 tablet day 6 take all tablets orally    Note: This dictation was prepared with Dragon dictation along with smaller phrase technology. Any transcriptional errors that result from this process are unintentional.   Hassan Rowan, MD 04/02/16 1903   Should be noted the patient received a second DuoNeb treatment while he was here with improvement of his breathing after the second DuoNeb.   Hassan Rowan, MD 04/06/16 1946

## 2016-04-05 LAB — CULTURE, GROUP A STREP (THRC)

## 2016-11-05 ENCOUNTER — Telehealth: Payer: Self-pay

## 2016-11-05 NOTE — Telephone Encounter (Signed)
Problem is worse at night when laying down. States he wakes up in the middle of the night. Patient was advise to seek medical attention and go to the  ER. May need to do further testing for further evaluation.

## 2016-11-05 NOTE — Telephone Encounter (Signed)
Patient called office c/o SOB, wheezing, chest tightness started a few days ago. Patient is seeing Dr. Judithann GravesBerglund. Patient is requesting a refill for albuterol and prednisone. Patient was advise from front staff he will need to seek medical attention right away to either go to UC or ER.

## 2016-11-06 ENCOUNTER — Emergency Department
Admission: EM | Admit: 2016-11-06 | Discharge: 2016-11-06 | Disposition: A | Discharge status: Home / Self Care | Payer: Self-pay | Attending: Student in an Organized Health Care Education/Training Program | Admitting: Student in an Organized Health Care Education/Training Program

## 2016-11-06 ENCOUNTER — Emergency Department: Payer: Self-pay

## 2016-11-06 ENCOUNTER — Encounter: Payer: Self-pay | Admitting: Emergency Medicine

## 2016-11-06 DIAGNOSIS — J441 Chronic obstructive pulmonary disease with (acute) exacerbation: Secondary | ICD-10-CM | POA: Insufficient documentation

## 2016-11-06 DIAGNOSIS — R0602 Shortness of breath: Secondary | ICD-10-CM | POA: Insufficient documentation

## 2016-11-06 DIAGNOSIS — J45909 Unspecified asthma, uncomplicated: Secondary | ICD-10-CM | POA: Insufficient documentation

## 2016-11-06 LAB — BASIC METABOLIC PANEL
Anion gap: 8 (ref 5–15)
BUN: 18 mg/dL (ref 6–20)
CHLORIDE: 107 mmol/L (ref 101–111)
CO2: 24 mmol/L (ref 22–32)
CREATININE: 0.87 mg/dL (ref 0.61–1.24)
Calcium: 8.7 mg/dL — ABNORMAL LOW (ref 8.9–10.3)
GFR calc Af Amer: >60 mL/min (ref >60)
GFR calc non Af Amer: >60 mL/min (ref >60)
GLUCOSE: 135 mg/dL — AB (ref 65–99)
Potassium: 4.3 mmol/L (ref 3.5–5.1)
Sodium: 139 mmol/L (ref 135–145)

## 2016-11-06 LAB — CBC
HCT: 44.3 % (ref 40.0–52.0)
Hemoglobin: 15 g/dL (ref 13.0–18.0)
MCH: 28.5 pg (ref 26.0–34.0)
MCHC: 34 g/dL (ref 32.0–36.0)
MCV: 84 fL (ref 80.0–100.0)
Platelets: 240 10*3/uL (ref 150–440)
RBC: 5.28 MIL/uL (ref 4.40–5.90)
RDW: 14.5 % (ref 11.5–14.5)
WBC: 8.7 10*3/uL (ref 3.8–10.6)

## 2016-11-06 LAB — TROPONIN I
Troponin I: <0.03 ng/mL (ref <0.03)
Troponin I: <0.03 ng/mL (ref <0.03)

## 2016-11-06 MED ORDER — PREDNISONE 20 MG PO TABS
60.0000 mg | ORAL_TABLET | Freq: Once | ORAL | Status: AC
  Administered 2016-11-06: 60 mg via ORAL
  Filled 2016-11-06: qty 3

## 2016-11-06 MED ORDER — IPRATROPIUM-ALBUTEROL 0.5-2.5 (3) MG/3ML IN SOLN
3.0000 mL | Freq: Once | RESPIRATORY_TRACT | Status: AC
  Administered 2016-11-06: 3 mL via RESPIRATORY_TRACT
  Filled 2016-11-06: qty 3

## 2016-11-06 MED ORDER — MICONAZOLE NITRATE 2 % EX AERO: 1.0000 "application " | INHALATION_SPRAY | Freq: Two times a day (BID) | CUTANEOUS | 1 refills | Status: AC

## 2016-11-06 MED ORDER — PREDNISONE 10 MG PO TABS: 10.0000 mg | ORAL_TABLET | Freq: Every day | ORAL | 0 refills | Status: DC

## 2016-11-06 MED ORDER — IPRATROPIUM-ALBUTEROL 0.5-2.5 (3) MG/3ML IN SOLN: 3.0000 mL | RESPIRATORY_TRACT | 1 refills | Status: DC | PRN

## 2016-11-06 NOTE — ED Provider Notes (Signed)
[facility] Emergency Department Provider Note    First MD Initiated Contact with Patient 11/06/16 1953     (approximate)  I have reviewed the triage vital signs and the nursing notes.   HISTORY  Chief Complaint Chest Pain and Shortness of Breath    HPI Ronnie Hunt is a 51 y.o. male with a history of asthma not currently on any steroids or moderate presents with 3 weeks of worsening shortness of breath and right-sided chest pain worse with cough. No fevers. No worsening exertional chest pain. Pain is worse with palpation. States that the shortness of breath does get worse with hot weather. Has not seen his primary care physician for this but there have been discussions about being referred to pulmonology. Denies any lower extremity swelling. No orthopnea.   Past Medical History:  Diagnosis Date  . Asthma    Family History  Problem Relation Age of Onset  . Stroke Father   . Cancer Sister   . Colon cancer Brother        age 50   Past Surgical History:  Procedure Laterality Date  . COLONOSCOPY  2007   normal   Patient Active Problem List   Diagnosis Date Noted  . Family history of colon cancer 01/01/2015  . Gastro-esophageal reflux disease without esophagitis 01/01/2015  . Asthma, mild intermittent 01/01/2015  . Hive 01/01/2015      Prior to Admission medications   Medication Sig Start Date End Date Taking? Authorizing Provider  albuterol (PROVENTIL HFA;VENTOLIN HFA) 108 (90 Base) MCG/ACT inhaler Inhale 2 puffs into the lungs every 4 (four) hours as needed for wheezing or shortness of breath. 02/12/16   Reubin MilanBerglund, Laura H, MD  albuterol (PROVENTIL HFA;VENTOLIN HFA) 108 (90 Base) MCG/ACT inhaler Inhale 2 puffs into the lungs every 4 (four) hours as needed for wheezing or shortness of breath. 04/02/16   Hassan RowanWade, Eugene, MD  albuterol (PROVENTIL) (2.5 MG/3ML) 0.083% nebulizer solution Take 3 mLs (2.5 mg total) by nebulization every 6 (six)  hours as needed for wheezing or shortness of breath. 02/12/16   Reubin MilanBerglund, Laura H, MD  azithromycin (ZITHROMAX) 500 MG tablet 1 tablet a day for 5 days 04/02/16   Hassan RowanWade, Eugene, MD  chlorpheniramine-HYDROcodone [facility](TUSSIONEX PENNKINETIC ER) 10-8 MG/5ML SUER Take 5 mLs by mouth every 12 (twelve) hours as needed for cough. 04/02/16   Hassan RowanWade, Eugene, MD  ipratropium-albuterol (DUONEB) 0.5-2.5 (3) MG/3ML SOLN Take 3 mLs by nebulization every 4 (four) hours as needed. 11/06/16   Willy Eddyobinson, Savior Himebaugh, MD  Miconazole Nitrate 2 % AERO Apply 1 application topically 2 (two) times daily. Apply to affected groin and arm pit 11/06/16   Willy Eddyobinson, Shahin Knierim, MD  oseltamivir (TAMIFLU) 75 MG capsule Take 1 capsule (75 mg total) by mouth 2 (two) times daily. 04/02/16   Hassan RowanWade, Eugene, MD  oseltamivir (TAMIFLU) 75 MG capsule Take 1 capsule (75 mg total) by mouth 2 (two) times daily. 04/02/16   Hassan RowanWade, Eugene, MD  predniSONE (DELTASONE) 10 MG tablet Take 1 tablet (10 mg total) by mouth daily. Day 1-2: Take 50 mg  ( 5 pills) Day 3-4 : Take 40 mg (4pills) Day 5-6: Take 30 mg (3 pills) Day 7-8:  Take 20 mg (2 pills) Day 9:  Take 10mg  (1 pill) 11/06/16   Willy Eddyobinson, Jrue Jarriel, MD  predniSONE (STERAPRED UNI-PAK 21 TAB) 10 MG (21) TBPK tablet Sig 6 tablet day 1, 5 tablets day 2, 4 tablets day 3,,3tablets day 4, 2 tablets day 5, 1 tablet  day 6 take all tablets orally 04/02/16   Hassan Rowan, MD    Allergies Penicillins    Social History Social History  Substance Use Topics  . Smoking status: Never Smoker  . Smokeless tobacco: Never Used  . Alcohol use 2.4 oz/week    4 Standard drinks or equivalent per week    Review of Systems Patient denies headaches, rhinorrhea, blurry vision, numbness, shortness of breath, chest pain, edema, cough, abdominal pain, nausea, vomiting, diarrhea, dysuria, fevers, rashes or hallucinations unless otherwise stated above in HPI. ____________________________________________   PHYSICAL EXAM:  VITAL  SIGNS: Vitals:   11/06/16 2206 11/06/16 2212  BP: (!) 145/75 (!) 142/82  Pulse: 83 82  Resp: 18 18  Temp:    SpO2:  97%    Constitutional: Alert and oriented. Well appearing and in no acute distress. Eyes: Conjunctivae are normal.  Head: Atraumatic. Nose: No congestion/rhinnorhea. Mouth/Throat: Mucous membranes are moist.   Neck: No stridor. Painless ROM.  Cardiovascular: Normal rate, regular rhythm. Grossly normal heart sounds.  Good peripheral circulation. Respiratory: Normal respiratory effort.  No retractions. Lungs with diffuse musical wheeze, slightly prolonged expiratory phase Gastrointestinal: Soft and nontender. No distention. No abdominal bruits. No CVA tenderness. Musculoskeletal: No lower extremity tenderness nor edema.  No joint effusions. Neurologic:  Normal speech and language. No gross focal neurologic deficits are appreciated. No facial droop Skin:  Skin is warm, dry and intact. No rash noted. Psychiatric: Mood and affect are normal. Speech and behavior are normal.  ____________________________________________   LABS (all labs ordered are listed, but only abnormal results are displayed)  Results for orders placed or performed during the hospital encounter of 11/06/16 (from the past 24 hour(s))  Basic metabolic panel     Status: Abnormal   Collection Time: 11/06/16  2:47 PM  Result Value Ref Range   Sodium 139 135 - 145 mmol/L   Potassium 4.3 3.5 - 5.1 mmol/L   Chloride 107 101 - 111 mmol/L   CO2 24 22 - 32 mmol/L   Glucose, Bld 135 (H) 65 - 99 mg/dL   BUN 18 6 - 20 mg/dL   Creatinine, Ser 1.61 0.61 - 1.24 mg/dL   Calcium 8.7 (L) 8.9 - 10.3 mg/dL   GFR calc non Af Amer >60 >60 mL/min   GFR calc Af Amer >60 >60 mL/min   Anion gap 8 5 - 15  CBC     Status: None   Collection Time: 11/06/16  2:47 PM  Result Value Ref Range   WBC 8.7 3.8 - 10.6 K/uL   RBC 5.28 4.40 - 5.90 MIL/uL   Hemoglobin 15.0 13.0 - 18.0 g/dL   HCT 09.6 04.5 - 40.9 %   MCV 84.0 80.0 -  100.0 fL   MCH 28.5 26.0 - 34.0 pg   MCHC 34.0 32.0 - 36.0 g/dL   RDW 81.1 91.4 - 78.2 %   Platelets 240 150 - 440 K/uL  Troponin I     Status: None   Collection Time: 11/06/16  2:47 PM  Result Value Ref Range   Troponin I <0.03 <0.03 ng/mL  Troponin I     Status: None   Collection Time: 11/06/16  7:59 PM  Result Value Ref Range   Troponin I <0.03 <0.03 ng/mL   ____________________________________________  EKG My review and personal interpretation at Time: 14:49   Indication: sob  Rate: 80  Rhythm: sinus Axis: normal Other: no stemi, normal intervals, no right heart strain ____________________________________________  RADIOLOGY  I  personally reviewed all radiographic images ordered to evaluate for the above acute complaints and reviewed radiology reports and findings.  These findings were personally discussed with the patient.  Please see medical record for radiology report. ____________________________________________   PROCEDURES  Procedure(s) performed:  Procedures    Critical Care performed: no ____________________________________________   INITIAL IMPRESSION / ASSESSMENT AND PLAN / ED COURSE  Pertinent labs & imaging results that were available during my care of the patient were reviewed by me and considered in my medical decision making (see chart for details).  DDX: ashtma, bronchitis, pna, acs,   Tidus Upchurch is a 51 y.o. who presents to the ED with right-sided chest pain shortness of breath and wheezing with nonproductive cough. Patient is in no acute distress. Does have significant history of asthma and has diffuse wheezing on exam. No evidence of acute ischemia. He has no evidence of congestive heart failure. No evidence of consolidation. This is clinically not consistent with pulmonary embolism based on his lung sounds. Patient otherwise low risk Wells criteria.  Patient given nebulizer treats and steroids.  No hypoxia with ambulation.  Stable for  further workup as an outpatient.  Clinical Course as of Nov 06 2312  Fri Nov 06, 2016  2155 Patient was able to ambulate without any shortness of breath or hypoxia. Has significant improvement and lung sounds after nebulizer. Patient also mentioned that he is also having evidence of itching in his armpit and groin. Has evidence of jock itch and tinea. We will also give prescription for Lotrimin.  [PR]    Clinical Course User Index [PR] Willy Eddy, MD     ____________________________________________   FINAL CLINICAL IMPRESSION(S) / ED DIAGNOSES  Final diagnoses:  COPD exacerbation (HCC)      NEW MEDICATIONS STARTED DURING THIS VISIT:  Discharge Medication List as of 11/06/2016  9:48 PM    START taking these medications   Details  predniSONE (DELTASONE) 10 MG tablet Take 1 tablet (10 mg total) by mouth daily. Day 1-2: Take 50 mg  ( 5 pills) Day 3-4 : Take 40 mg (4pills) Day 5-6: Take 30 mg (3 pills) Day 7-8:  Take 20 mg (2 pills) Day 9:  Take 10mg  (1 pill), Starting Fri 11/06/2016, Print         Note:  This document was prepared using Dragon voice recognition software and may include unintentional dictation errors.    Willy Eddy, MD 11/06/16 [phone]

## 2016-11-06 NOTE — ED Triage Notes (Signed)
Pt reports non radiating right side chest pain with shortness of breath and wheezing for three weeks. Pt reports missing 1-2 days of work due to difficulty breathing. Pt reports the chest pain is worse at night and he is unable to lay on the right side. Pt uses a nebulizer but states it is not helping.

## 2016-11-06 NOTE — ED Notes (Signed)
Pt. Was able to ambulate  In room with no difficulty O2 sats remained above 96.  Pt. Also states he feels much better and able to take deeper breaths.

## 2016-11-06 NOTE — ED Notes (Signed)
Pt. Going home with wife. 

## 2016-11-06 NOTE — ED Notes (Signed)
Pt. States breathing difficulty during exhurstion.  Pt. States breathing problems while at work.  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

## 2017-03-08 ENCOUNTER — Ambulatory Visit (INDEPENDENT_AMBULATORY_CARE_PROVIDER_SITE_OTHER): Payer: Self-pay | Admitting: Internal Medicine

## 2017-03-08 ENCOUNTER — Encounter: Payer: Self-pay | Admitting: Internal Medicine

## 2017-03-08 VITALS — BP 124/84 | HR 108 | Ht 64.0 in | Wt 194.0 lb

## 2017-03-08 DIAGNOSIS — J45901 Unspecified asthma with (acute) exacerbation: Secondary | ICD-10-CM

## 2017-03-08 MED ORDER — IPRATROPIUM-ALBUTEROL 0.5-2.5 (3) MG/3ML IN SOLN: 3.0000 mL | RESPIRATORY_TRACT | 1 refills | Status: DC | PRN

## 2017-03-08 MED ORDER — ALBUTEROL SULFATE HFA 108 (90 BASE) MCG/ACT IN AERS: 2.0000 | INHALATION_SPRAY | RESPIRATORY_TRACT | 1 refills | Status: DC | PRN

## 2017-03-08 MED ORDER — AZITHROMYCIN 250 MG PO TABS: ORAL_TABLET | ORAL | 0 refills | Status: DC

## 2017-03-08 MED ORDER — PREDNISONE 10 MG PO TABS: 40.0000 mg | ORAL_TABLET | Freq: Every day | ORAL | 0 refills | Status: DC

## 2017-03-08 NOTE — Patient Instructions (Signed)
Claritin 10 mg once a day for allergies (loratidine 10 mg)

## 2017-03-08 NOTE — Progress Notes (Signed)
Date:  03/08/2017   Name:  Ronnie GamblerJulio Reynoso Uselman   DOB:  [birth year]   MRN:  [id number]   Chief Complaint: Asthma (Noticing more wheezing 5 days ago- coughing won't stop. Feels like loss of energy. Can't sleep good at night, wheezing and snoring. )  Asthma  He complains of chest tightness, cough, shortness of breath and wheezing. This is a recurrent problem. The current episode started in the past 7 days. The problem occurs constantly. The problem has been gradually worsening. The cough is productive of sputum. Pertinent negatives include no chest pain, fever, headaches, sore throat or trouble swallowing. His symptoms are aggravated by change in weather. His symptoms are alleviated by beta-agonist. His past medical history is significant for asthma.     Review of Systems  Constitutional: Negative for chills and fever.  HENT: Negative for congestion, sinus pressure, sore throat and trouble swallowing.   Eyes: Negative for visual disturbance.  Respiratory: Positive for cough, chest tightness, shortness of breath and wheezing.   Cardiovascular: Negative for chest pain, palpitations and leg swelling.  Gastrointestinal: Negative for abdominal pain.  Neurological: Negative for dizziness, weakness and headaches.    Patient Active Problem List   Diagnosis Date Noted  . Family history of colon cancer 01/01/2015  . Gastro-esophageal reflux disease without esophagitis 01/01/2015  . Asthma, mild intermittent 01/01/2015  . Hive 01/01/2015    Prior to Admission medications   Medication Sig Start Date End Date Taking? Authorizing Provider  albuterol (PROVENTIL HFA;VENTOLIN HFA) 108 (90 Base) MCG/ACT inhaler Inhale 2 puffs into the lungs every 4 (four) hours as needed for wheezing or shortness of breath. 04/02/16   Hassan RowanWade, Eugene, MD  ipratropium-albuterol (DUONEB) 0.5-2.5 (3) MG/3ML SOLN Take 3 mLs by nebulization every 4 (four) hours as needed. 11/06/16   Willy Eddyobinson, Patrick, MD  Miconazole Nitrate 2  % AERO Apply 1 application topically 2 (two) times daily. Apply to affected groin and arm pit 11/06/16   Willy Eddyobinson, Patrick, MD    Allergies  Allergen Reactions  . Penicillins Swelling    Past Surgical History:  Procedure Laterality Date  . COLONOSCOPY  2007   normal    Social History   Tobacco Use  . Smoking status: Never Smoker  . Smokeless tobacco: Never Used  Substance Use Topics  . Alcohol use: Yes    Alcohol/week: 2.4 oz    Types: 4 Standard drinks or equivalent per week  . Drug use: No     Medication list has been reviewed and updated.  PHQ 2/9 Scores 03/08/2017  PHQ - 2 Score 1  PHQ- 9 Score 1    Physical Exam  Constitutional: He is oriented to person, place, and time. He appears well-developed. No distress.  HENT:  Head: Normocephalic and atraumatic.  Cardiovascular: Normal rate, regular rhythm and normal heart sounds.  Pulmonary/Chest: Effort normal. No respiratory distress. He has wheezes.  Musculoskeletal: Normal range of motion.  Neurological: He is alert and oriented to person, place, and time.  Skin: Skin is warm and dry. No rash noted.  Psychiatric: He has a normal mood and affect. His behavior is normal. Thought content normal.  Nursing note and vitals reviewed.   BP 124/84   Pulse (!) 108   Ht 5\' 4"  (1.626 m)   Wt 194 lb (88 kg)   SpO2 94%   BMI 33.30 kg/m   Assessment and Plan: 1. Moderate asthma with acute exacerbation, unspecified whether persistent - azithromycin (ZITHROMAX Z-PAK) 250 MG tablet; UAD  Dispense: 6 each; Refill: 0 - predniSONE (DELTASONE) 10 MG tablet; Take 4 tablets (40 mg total) by mouth daily with breakfast.  Dispense: 16 tablet; Refill: 0 - ipratropium-albuterol (DUONEB) 0.5-2.5 (3) MG/3ML SOLN; Take 3 mLs by nebulization every 4 (four) hours as needed.  Dispense: 360 mL; Refill: 1 - albuterol (PROVENTIL HFA;VENTOLIN HFA) 108 (90 Base) MCG/ACT inhaler; Inhale 2 puffs into the lungs every 4 (four) hours as needed for  wheezing or shortness of breath.  Dispense: 1 Inhaler; Refill: 1   Meds ordered this encounter  Medications  . azithromycin (ZITHROMAX Z-PAK) 250 MG tablet    Sig: UAD    Dispense:  6 each    Refill:  0  . predniSONE (DELTASONE) 10 MG tablet    Sig: Take 4 tablets (40 mg total) by mouth daily with breakfast.    Dispense:  16 tablet    Refill:  0  . ipratropium-albuterol (DUONEB) 0.5-2.5 (3) MG/3ML SOLN    Sig: Take 3 mLs by nebulization every 4 (four) hours as needed.    Dispense:  360 mL    Refill:  1  . albuterol (PROVENTIL HFA;VENTOLIN HFA) 108 (90 Base) MCG/ACT inhaler    Sig: Inhale 2 puffs into the lungs every 4 (four) hours as needed for wheezing or shortness of breath.    Dispense:  1 Inhaler    Refill:  1    Pt requests Ventolin brand    Partially dictated using Animal nutritionistDragon software. Any errors are unintentional.  Bari EdwardLaura Daeveon Zweber, MD [facility]  03/08/2017

## 2017-03-09 IMAGING — CR DG CHEST 2V
2 series · 2 of 2 positions shown · non-contrast
Comparison: Chest radiograph 11/24/2014.

CLINICAL DATA: Patient with asthma exacerbation.  Tachycardia.

EXAM:
CHEST  2 VIEW

[chest pa]
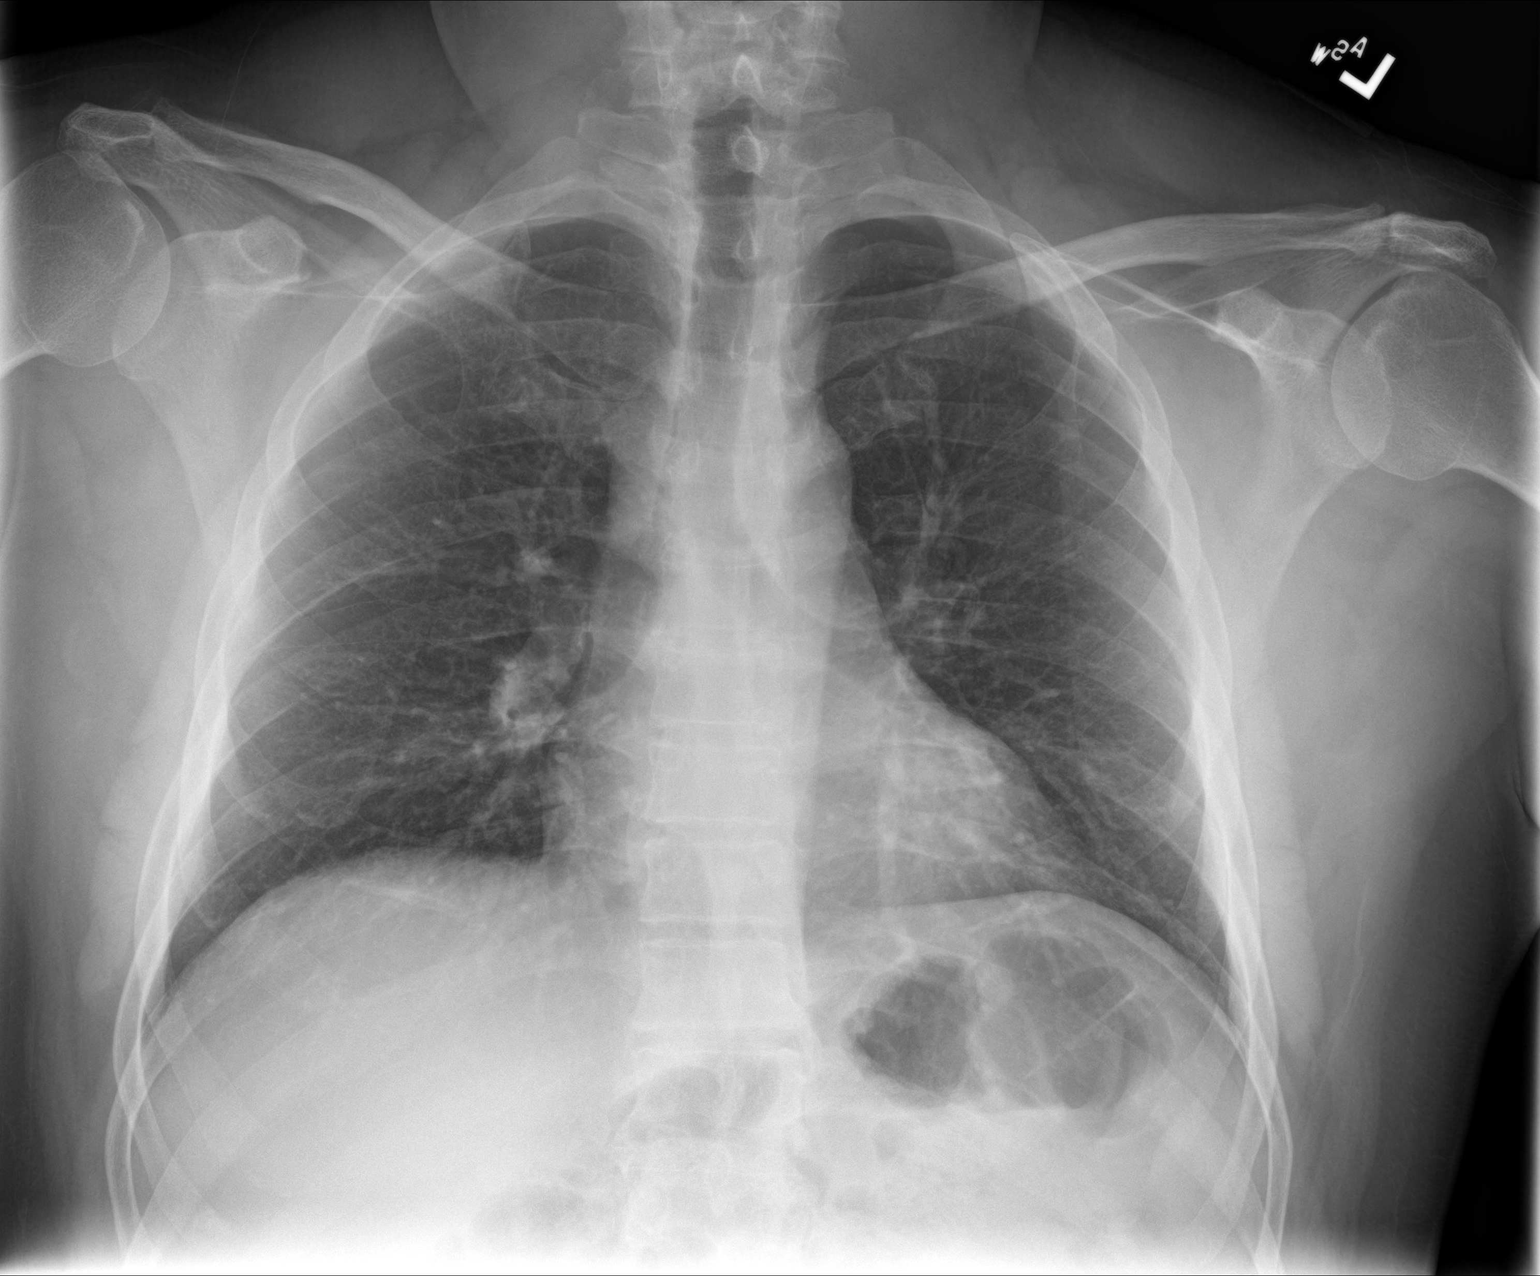

[chest lat]
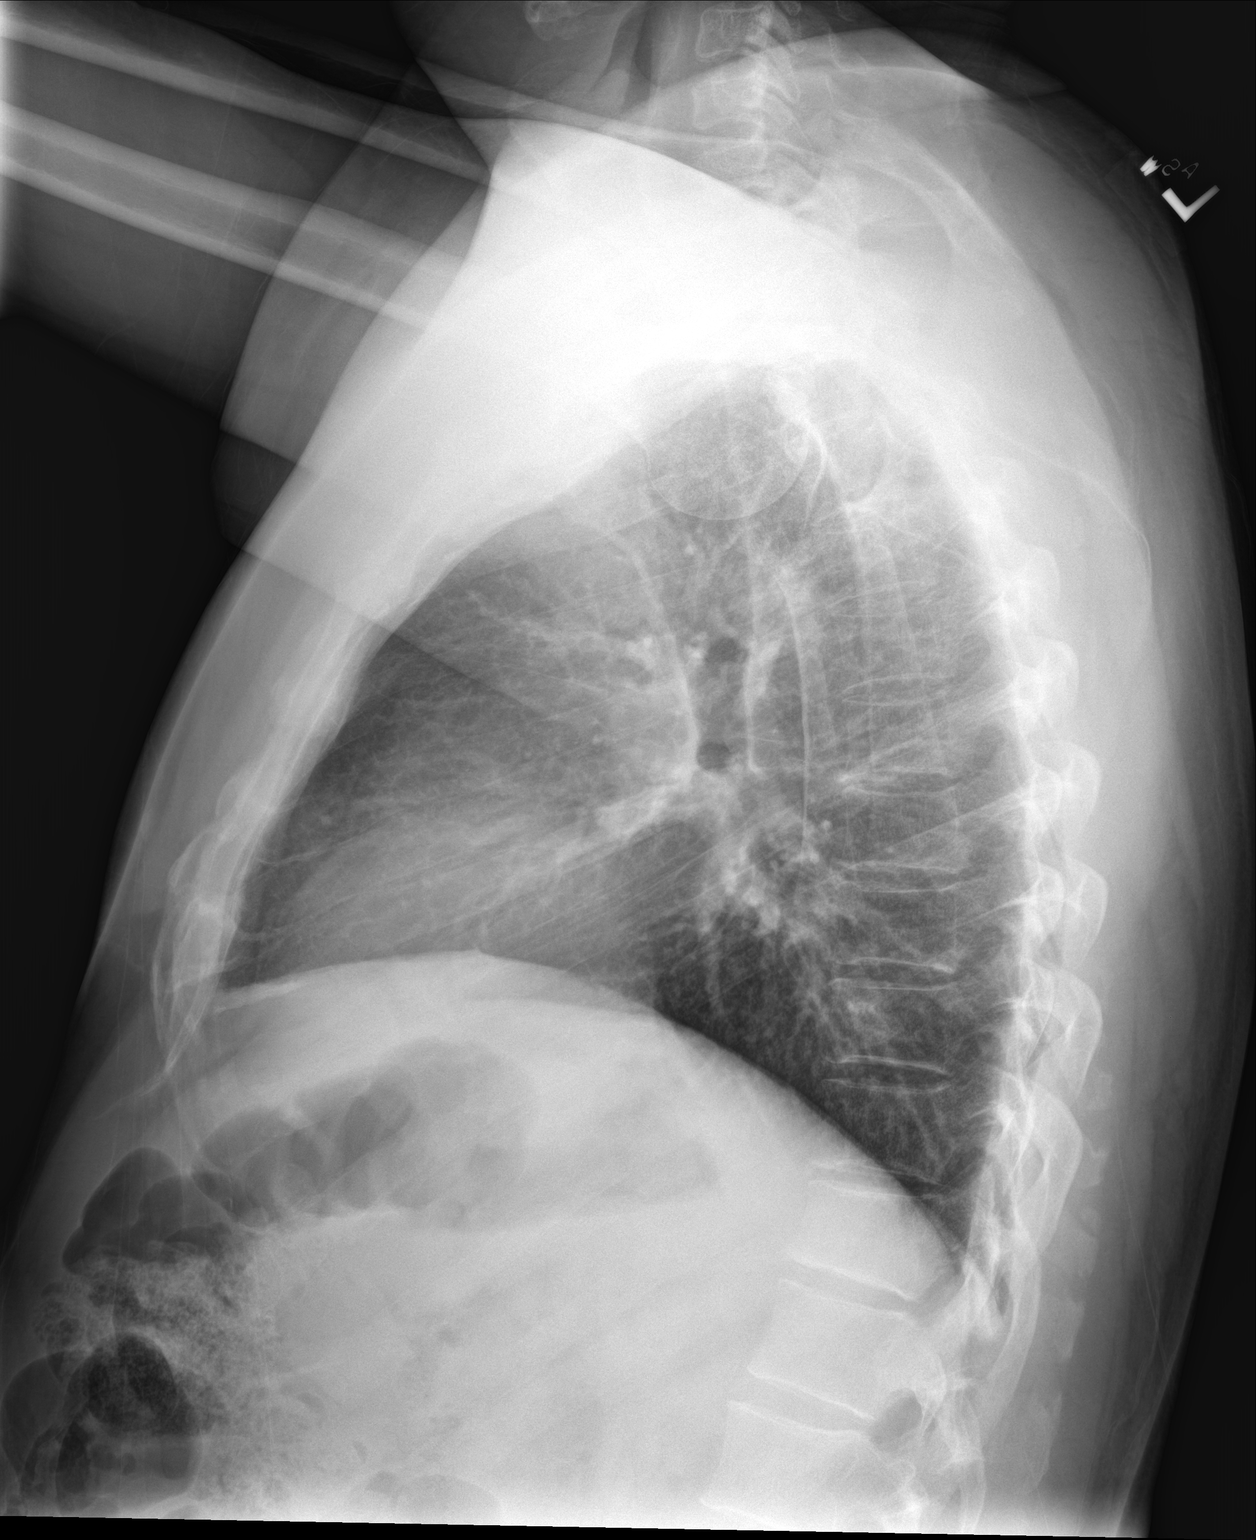

[2 of 2 positions shown; findings below may reference images not displayed]

FINDINGS: Stable cardiac and mediastinal contours. No consolidative pulmonary
opacities. No pleural effusion or pneumothorax. Thoracic spine
degenerative changes.
IMPRESSION: No active cardiopulmonary disease.

## 2017-04-12 ENCOUNTER — Other Ambulatory Visit: Payer: Self-pay | Admitting: Internal Medicine

## 2017-04-12 ENCOUNTER — Other Ambulatory Visit: Payer: Self-pay

## 2017-04-12 DIAGNOSIS — J4520 Mild intermittent asthma, uncomplicated: Secondary | ICD-10-CM

## 2017-04-12 DIAGNOSIS — J45901 Unspecified asthma with (acute) exacerbation: Secondary | ICD-10-CM

## 2017-04-12 MED ORDER — ALBUTEROL SULFATE HFA 108 (90 BASE) MCG/ACT IN AERS: 2.0000 | INHALATION_SPRAY | RESPIRATORY_TRACT | 1 refills | Status: AC | PRN

## 2017-10-13 IMAGING — CR DG CHEST 2V
1 series · 2 of 2 positions shown · non-contrast
Comparison: 04/02/2016

CLINICAL DATA: Chest pain and shortness of breath

EXAM:
CHEST  2 VIEW

[Series 1: dg chest 2 view · 0.14mm/px · 2 of 2 slices shown]
[im 1/2]
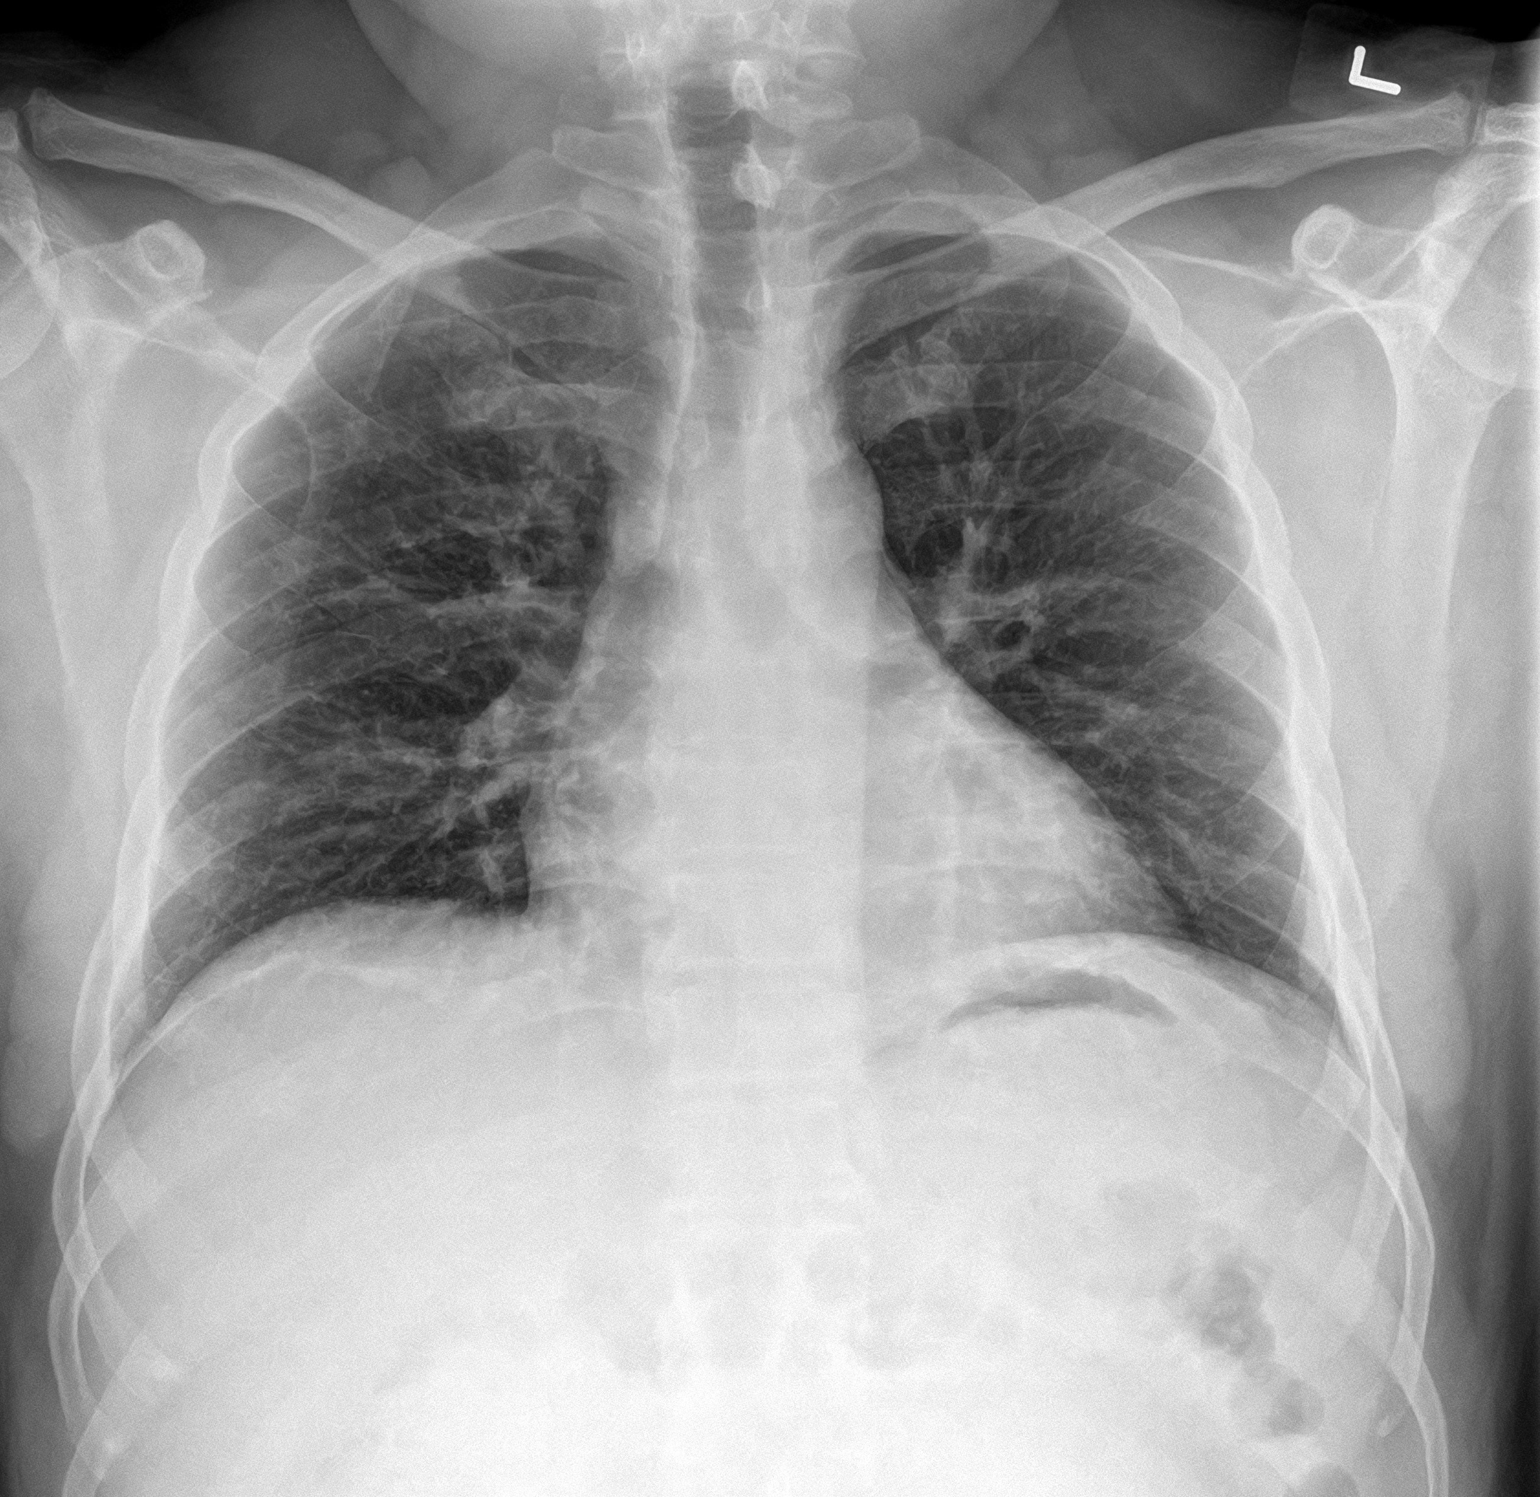
[im 2/2]
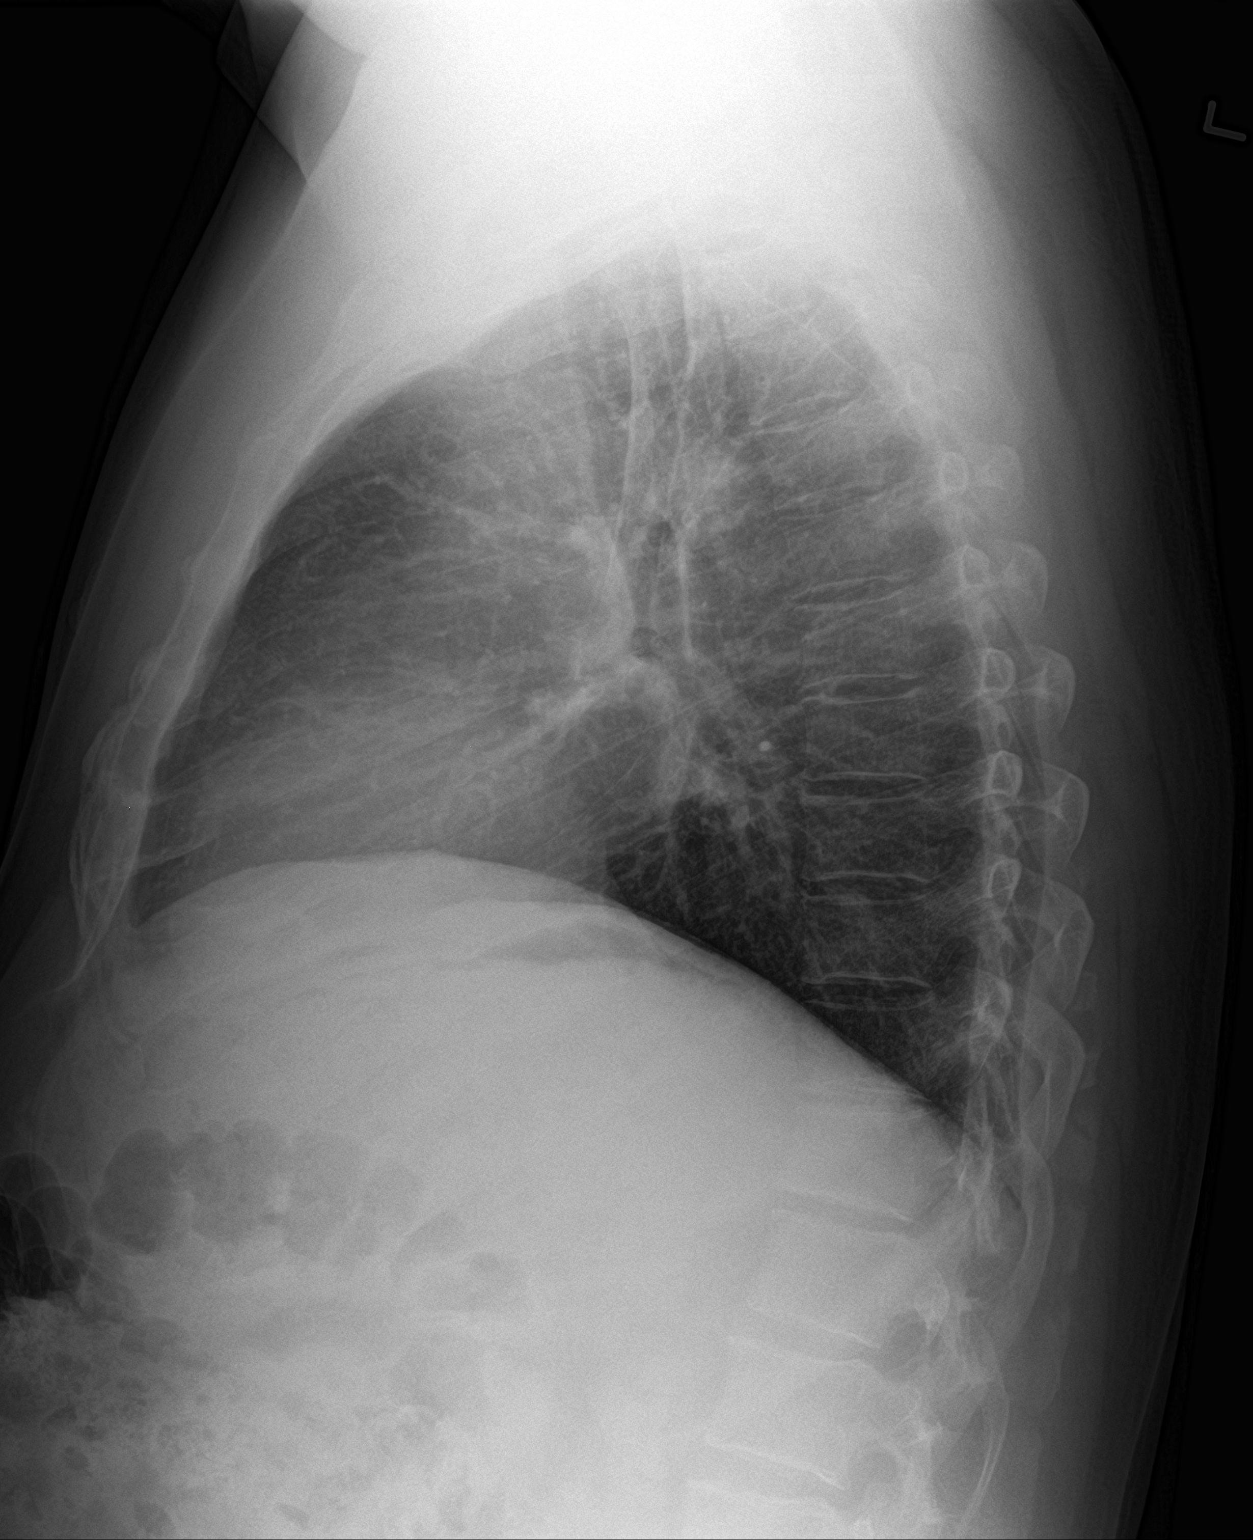

[2 of 2 positions shown; findings below may reference images not displayed]

FINDINGS: Normal heart size and mediastinal contours. No acute infiltrate or
edema. No effusion or pneumothorax. No acute osseous findings.
IMPRESSION: No evidence of active disease.

## 2018-02-04 ENCOUNTER — Other Ambulatory Visit: Payer: Self-pay | Admitting: Internal Medicine

## 2018-02-04 DIAGNOSIS — J45901 Unspecified asthma with (acute) exacerbation: Secondary | ICD-10-CM

## 2018-02-07 ENCOUNTER — Ambulatory Visit (INDEPENDENT_AMBULATORY_CARE_PROVIDER_SITE_OTHER): Payer: Self-pay | Admitting: Internal Medicine

## 2018-02-07 ENCOUNTER — Encounter: Payer: Self-pay | Admitting: Internal Medicine

## 2018-02-07 VITALS — BP 128/80 | HR 95 | Temp 98.8°F | Ht 64.0 in | Wt 183.0 lb

## 2018-02-07 DIAGNOSIS — J029 Acute pharyngitis, unspecified: Secondary | ICD-10-CM

## 2018-02-07 DIAGNOSIS — J4541 Moderate persistent asthma with (acute) exacerbation: Secondary | ICD-10-CM

## 2018-02-07 MED ORDER — PREDNISONE 10 MG PO TABS: 10.0000 mg | ORAL_TABLET | ORAL | 0 refills | Status: AC

## 2018-02-07 MED ORDER — AZITHROMYCIN 250 MG PO TABS: ORAL_TABLET | ORAL | 0 refills | Status: AC

## 2018-02-07 NOTE — Patient Instructions (Addendum)
For asthma:   Use the duoneb solution in the nebulizer up to 4 times per day                        Use the albuterol inhaler in between the nebulizer every 4 hours if needed                        Use Asmanex inhaler - one puff twice a day - rinse mouth after use

## 2018-02-07 NOTE — Progress Notes (Signed)
Date:  02/07/2018   Name:  Ronnie GamblerJulio Reynoso Hunt   DOB:  [birth year]   MRN:  [id number]   Chief Complaint: Asthma (Wants to discuss diff inhaler. Friend let him use Symbicort inhaler 160/4.5 and it helps well. ) and Cough (Started thursday. Coughing with yellow mucous. Having to sleep sitting up due to cough. Staretd with sore throat and chills. )  Asthma  He complains of chest tightness, cough and wheezing. There is no hemoptysis, hoarse voice or sputum production. This is a chronic problem. The problem occurs daily. The problem has been unchanged. Pertinent negatives include no chest pain, fever or headaches. His symptoms are alleviated by beta-agonist and ipratropium. He reports significant improvement on treatment. His past medical history is significant for asthma.  Sore Throat   This is a new problem. There has been no fever. Associated symptoms include coughing. Pertinent negatives include no headaches or hoarse voice.   The patient has been using nebulizer duoneb only partial dose each time.  On usual days, he uses it none or once, on other days he will use a partial dose every 2-3 hours. He does not think that Ventolin is helpful.  A friend gave him Symbicort 160/4.5 which he thinks works well.  He is using that only as needed, not daily. He is constrained by finances and lack of insurance.  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  Review of Systems  Constitutional: Negative for chills, fatigue and fever.  HENT: Negative for hoarse voice.   Respiratory: Positive for cough, chest tightness and wheezing. Negative for hemoptysis and sputum production.   Cardiovascular: Negative for chest pain, palpitations and leg swelling.  Neurological: Negative for dizziness and headaches.    Patient Active Problem List   Diagnosis Date Noted  . Family history of colon cancer 01/01/2015  . Gastro-esophageal reflux disease without  esophagitis 01/01/2015  . Asthma, mild intermittent 01/01/2015  . Hive 01/01/2015    Allergies  Allergen Reactions  . Penicillins Swelling    Past Surgical History:  Procedure Laterality Date  . COLONOSCOPY  2007   normal    Social History   Tobacco Use  . Smoking status: Never Smoker  . Smokeless tobacco: Never Used  Substance Use Topics  . Alcohol use: Yes    Alcohol/week: 4.0 standard drinks    Types: 4 Standard drinks or equivalent per week  . Drug use: No     Medication list has been reviewed and updated.  Current Meds  Medication Sig  . albuterol (PROVENTIL) (2.5 MG/3ML) 0.083% nebulizer solution USE ONE VIAL IN NEBULIZER EVERY 6 HOURS AS NEEDED FOR WHEEZING OR SHORTNESS OF BREATH  . ipratropium-albuterol (DUONEB) 0.5-2.5 (3) MG/3ML SOLN USE 1 VIAL IN NEBULIZER EVERY 4 HOURS AS NEEDED  . Miconazole Nitrate 2 % AERO Apply 1 application topically 2 (two) times daily. Apply to affected groin and arm pit    PHQ 2/9 Scores 03/08/2017  PHQ - 2 Score 1  PHQ- 9 Score 1    Physical Exam  Constitutional: He is oriented to person, place, and time. He appears well-developed. No distress.  HENT:  Head: Normocephalic and atraumatic.  Nose: Right sinus exhibits no maxillary sinus tenderness. Left sinus exhibits no maxillary sinus tenderness.  Mouth/Throat: Posterior oropharyngeal erythema present. No oropharyngeal exudate or posterior oropharyngeal edema.  Neck: Normal range of motion. Neck supple.  Cardiovascular: Normal rate, regular rhythm and normal heart sounds.  Pulmonary/Chest: Effort normal. No respiratory distress. He has decreased breath sounds. He has no wheezes. He has no rhonchi.  Musculoskeletal: Normal range of motion.  Lymphadenopathy:    He has no cervical adenopathy.  Neurological: He is alert and oriented to person, place, and time.  Skin: Skin is warm and dry. No rash noted.  Psychiatric: He has a normal mood and affect. His behavior is normal.  Thought content normal.  Nursing note and vitals reviewed.   BP 128/80 (BP Location: Right Arm, Patient Position: Sitting, Cuff Size: Normal)   Pulse 95   Temp 98.8 F (37.1 C) (Oral)   Ht 5\' 4"  (1.626 m)   Wt 183 lb (83 kg)   SpO2 96%   BMI 31.41 kg/m   Assessment and Plan: 1. Moderate persistent asthma with exacerbation Will give steroid taper Continue duonebs up to 4 times a day as needed Begin Asmanex 100 mcg - one puff bid every day Use Ventolin every 4 hours in between duonebs if needed - predniSONE (DELTASONE) 10 MG tablet; Take 1 tablet (10 mg total) by mouth as directed for 6 days. Take 6,5,4,3,2,1 then stop  Dispense: 21 tablet; Refill: 0  2. Pharyngitis, unspecified etiology - azithromycin (ZITHROMAX Z-PAK) 250 MG tablet; UAD  Dispense: 6 each; Refill: 0   Partially dictated using Animal nutritionist. Any errors are unintentional.  Bari Edward, MD [facility]  02/07/2018
